# Patient Record
Sex: Male | Born: 1955 | Race: White | Hispanic: No | Marital: Married | Smoking: Former smoker
Health system: Southern US, Community
[De-identification: ages and names within clinical notes are randomized; demographics above are authoritative.]

## PROBLEM LIST (undated history)

## (undated) DIAGNOSIS — K4090 Unilateral inguinal hernia, without obstruction or gangrene, not specified as recurrent: Secondary | ICD-10-CM

## (undated) DIAGNOSIS — Z8719 Personal history of other diseases of the digestive system: Secondary | ICD-10-CM

## (undated) DIAGNOSIS — Q2111 Secundum atrial septal defect: Secondary | ICD-10-CM

## (undated) DIAGNOSIS — M549 Dorsalgia, unspecified: Secondary | ICD-10-CM

## (undated) DIAGNOSIS — I2510 Atherosclerotic heart disease of native coronary artery without angina pectoris: Secondary | ICD-10-CM

## (undated) DIAGNOSIS — E119 Type 2 diabetes mellitus without complications: Secondary | ICD-10-CM

## (undated) DIAGNOSIS — I480 Paroxysmal atrial fibrillation: Secondary | ICD-10-CM

## (undated) DIAGNOSIS — K760 Fatty (change of) liver, not elsewhere classified: Secondary | ICD-10-CM

## (undated) DIAGNOSIS — K5792 Diverticulitis of intestine, part unspecified, without perforation or abscess without bleeding: Secondary | ICD-10-CM

## (undated) DIAGNOSIS — I10 Essential (primary) hypertension: Secondary | ICD-10-CM

## (undated) DIAGNOSIS — E785 Hyperlipidemia, unspecified: Secondary | ICD-10-CM

## (undated) DIAGNOSIS — Z8546 Personal history of malignant neoplasm of prostate: Secondary | ICD-10-CM

## (undated) DIAGNOSIS — K7689 Other specified diseases of liver: Secondary | ICD-10-CM

## (undated) DIAGNOSIS — G4730 Sleep apnea, unspecified: Secondary | ICD-10-CM

## (undated) DIAGNOSIS — R7303 Prediabetes: Secondary | ICD-10-CM

## (undated) DIAGNOSIS — M48061 Spinal stenosis, lumbar region without neurogenic claudication: Secondary | ICD-10-CM

## (undated) DIAGNOSIS — Q211 Atrial septal defect: Secondary | ICD-10-CM

## (undated) HISTORY — DX: Diverticulitis of intestine, part unspecified, without perforation or abscess without bleeding: K57.92

## (undated) HISTORY — PX: FOOT SURGERY: SHX648

## (undated) HISTORY — DX: Essential (primary) hypertension: I10

## (undated) HISTORY — DX: Personal history of other diseases of the digestive system: Z87.19

## (undated) HISTORY — PX: APPENDECTOMY: SHX54

## (undated) HISTORY — DX: Hyperlipidemia, unspecified: E78.5

## (undated) HISTORY — DX: Dorsalgia, unspecified: M54.9

## (undated) HISTORY — PX: LITHOTRIPSY: SUR834

## (undated) HISTORY — DX: Atherosclerotic heart disease of native coronary artery without angina pectoris: I25.10

## (undated) HISTORY — PX: CARDIAC CATHETERIZATION: SHX172

## (undated) HISTORY — DX: Prediabetes: R73.03

## (undated) HISTORY — PX: OTHER SURGICAL HISTORY: SHX169

## (undated) HISTORY — PX: HERNIA REPAIR: SHX51

## (undated) HISTORY — DX: Secundum atrial septal defect: Q21.11

## (undated) HISTORY — PX: CORONARY ARTERY BYPASS GRAFT: SHX141

## (undated) HISTORY — DX: Atrial septal defect: Q21.1

---

## 2004-08-06 ENCOUNTER — Emergency Department: Payer: Self-pay | Admitting: Emergency Medicine

## 2007-02-17 ENCOUNTER — Ambulatory Visit: Payer: Self-pay

## 2008-08-07 ENCOUNTER — Emergency Department: Payer: Self-pay | Admitting: Emergency Medicine

## 2009-04-12 ENCOUNTER — Encounter: Payer: Self-pay | Admitting: Cardiovascular Disease

## 2009-05-10 ENCOUNTER — Encounter: Payer: Self-pay | Admitting: Cardiovascular Disease

## 2009-06-02 DIAGNOSIS — E785 Hyperlipidemia, unspecified: Secondary | ICD-10-CM | POA: Insufficient documentation

## 2009-06-02 DIAGNOSIS — I2510 Atherosclerotic heart disease of native coronary artery without angina pectoris: Secondary | ICD-10-CM | POA: Insufficient documentation

## 2009-06-02 DIAGNOSIS — I10 Essential (primary) hypertension: Secondary | ICD-10-CM | POA: Insufficient documentation

## 2009-06-03 ENCOUNTER — Ambulatory Visit (HOSPITAL_COMMUNITY): Admission: RE | Admit: 2009-06-03 | Discharge: 2009-06-03 | Payer: Self-pay | Admitting: Cardiovascular Disease

## 2009-06-03 ENCOUNTER — Ambulatory Visit: Payer: Self-pay | Admitting: Cardiovascular Disease

## 2009-06-03 ENCOUNTER — Encounter: Payer: Self-pay | Admitting: Cardiovascular Disease

## 2009-06-03 DIAGNOSIS — Q211 Atrial septal defect: Secondary | ICD-10-CM

## 2009-06-10 ENCOUNTER — Encounter (INDEPENDENT_AMBULATORY_CARE_PROVIDER_SITE_OTHER): Payer: Self-pay | Admitting: *Deleted

## 2009-06-17 ENCOUNTER — Telehealth: Payer: Self-pay | Admitting: Cardiovascular Disease

## 2009-06-20 ENCOUNTER — Encounter: Payer: Self-pay | Admitting: Cardiovascular Disease

## 2009-06-28 ENCOUNTER — Encounter: Payer: Self-pay | Admitting: Cardiovascular Disease

## 2009-10-19 ENCOUNTER — Ambulatory Visit: Payer: Self-pay | Admitting: Family Medicine

## 2010-09-07 NOTE — Consult Note (Signed)
Summary: [facility]   [facility]   Imported By: Roderic Ovens 09/13/2009 12:23:06  _____________________________________________________________________  External Attachment:    Type:   Image     Comment:   External Document

## 2011-04-04 ENCOUNTER — Encounter: Payer: Self-pay | Admitting: Cardiovascular Disease

## 2011-07-16 ENCOUNTER — Ambulatory Visit: Payer: Self-pay | Admitting: Cardiovascular Disease

## 2011-07-19 ENCOUNTER — Encounter: Payer: Self-pay | Admitting: *Deleted

## 2011-07-19 ENCOUNTER — Encounter: Payer: Self-pay | Admitting: Cardiovascular Disease

## 2011-07-20 ENCOUNTER — Ambulatory Visit: Payer: Self-pay | Admitting: Cardiovascular Disease

## 2011-08-16 ENCOUNTER — Encounter: Payer: Self-pay | Admitting: Cardiovascular Disease

## 2011-08-16 ENCOUNTER — Ambulatory Visit (INDEPENDENT_AMBULATORY_CARE_PROVIDER_SITE_OTHER): Payer: 59 | Admitting: Cardiovascular Disease

## 2011-08-16 VITALS — BP 138/88 | HR 51 | Wt 240.0 lb

## 2011-08-16 DIAGNOSIS — I2510 Atherosclerotic heart disease of native coronary artery without angina pectoris: Secondary | ICD-10-CM

## 2011-08-16 DIAGNOSIS — Q211 Atrial septal defect: Secondary | ICD-10-CM

## 2011-08-16 DIAGNOSIS — I10 Essential (primary) hypertension: Secondary | ICD-10-CM

## 2011-08-16 DIAGNOSIS — E785 Hyperlipidemia, unspecified: Secondary | ICD-10-CM

## 2011-08-16 NOTE — Progress Notes (Signed)
Hardie is the son of one of my patients Jonny Ruiz who died  In 2008-01-19. I don't have records on him. He has previously been seen by Dr. Gabriel Rung. He had a ASD occluder done by Dr. Starling Manns at [facility] in January 18, 2001. He has been under a lot of stress. 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 He has long standing HTN Rx by Dr. Dossie Arbour. He has had a normal cath after his occluder device. Echo done 01-18-09 reviewed.  Occluder in good position with no residual PFP/ASD and only mild LAE .  He denies dyspnea, palpitatoins, SSCP or syncope. He would like to simplify his BP meds and I suggested that he discuss this with Dr. Dossie Arbour.   ROS: Denies fever, malais, weight loss, blurry vision, decreased visual acuity, cough, sputum, SOB, hemoptysis, pleuritic pain, palpitaitons, heartburn, abdominal pain, melena, lower extremity edema, claudication, or rash.  All other systems reviewed and negative  General: Affect appropriate Healthy:  appears stated age HEENT: normal Neck supple with no adenopathy JVP normal no bruits no thyromegaly Lungs clear with no wheezing and good diaphragmatic motion Heart:  S1/S2 no murmur,rub, gallop or click PMI normal Abdomen: benighn, BS positve, no tenderness, no AAA no bruit.  No HSM or HJR Distal pulses intact with no bruits No edema Neuro non-focal Skin warm and dry No muscular weakness   Current Outpatient Prescriptions  Medication Sig Dispense Refill  . allopurinol (ZYLOPRIM) 300 MG tablet Take 300 mg by mouth daily.        Marland Kitchen aspirin 81 MG tablet Take 81 mg by mouth daily.        . celecoxib (CELEBREX) 200 MG capsule Take 200 mg by mouth daily.      . cloNIDine (CATAPRES) 0.1 MG tablet Take 0.1 mg by mouth 2 (two) times daily.      Marland Kitchen diltiazem (DILACOR XR) 240 MG 24 hr capsule Take 240 mg by mouth daily.        Marland Kitchen ezetimibe (ZETIA) 10 MG tablet Take 10 mg by mouth daily.        Marland Kitchen ibuprofen (ADVIL,MOTRIN) 200 MG tablet Take by mouth every 6 (six) hours as needed.      Marland Kitchen  losartan-hydrochlorothiazide (HYZAAR) 100-25 MG per tablet Take 1 tablet by mouth daily.      . Nebivolol HCl (BYSTOLIC) 20 MG TABS Take 1 tablet by mouth daily.      Marland Kitchen omega-3 acid ethyl esters (LOVAZA) 1 G capsule Take 1 g by mouth 2 (two) times daily.        Allergies  Amlodipine besy-benazepril hcl  Electrocardiogram:  SB rate 51  PR 230 nonspecific T wave changes  Assessment and Plan

## 2011-08-16 NOTE — Assessment & Plan Note (Signed)
Intact by echo 2010  Some conduction issues on ECG.  F/U ECG in a year

## 2011-08-16 NOTE — Assessment & Plan Note (Signed)
Discussed limiting use of salt, NSAI's weight loss with low carb diet.  Has had w/u to R/O RAS and secondary causes.  F/U primary

## 2011-08-16 NOTE — Assessment & Plan Note (Signed)
F/U primary  Been off crestor for 6 months and really no change in arthritis.  Only change is ease of urination.

## 2011-08-16 NOTE — Patient Instructions (Signed)
Your physician wants you to follow-up in: 1 year. You will receive a reminder letter in the mail two months in advance. If you don't receive a letter, please call our office to schedule the follow-up appointment.  

## 2011-09-18 ENCOUNTER — Emergency Department: Payer: Self-pay | Admitting: Emergency Medicine

## 2011-09-18 ENCOUNTER — Telehealth: Payer: Self-pay | Admitting: Cardiovascular Disease

## 2011-09-18 LAB — COMPREHENSIVE METABOLIC PANEL
Anion Gap: 9 (ref 7–16)
Bilirubin,Total: 0.5 mg/dL (ref 0.2–1.0)
Calcium, Total: 9.2 mg/dL (ref 8.5–10.1)
Creatinine: 1.56 mg/dL — ABNORMAL HIGH (ref 0.60–1.30)
EGFR (Non-African Amer.): 50 — ABNORMAL LOW
Glucose: 133 mg/dL — ABNORMAL HIGH (ref 65–99)
Osmolality: 290 (ref 275–301)
Potassium: 3.7 mmol/L (ref 3.5–5.1)
Sodium: 141 mmol/L (ref 136–145)

## 2011-09-18 LAB — URINALYSIS, COMPLETE
Bacteria: NONE SEEN
Bilirubin,UR: NEGATIVE
Blood: NEGATIVE
Glucose,UR: NEGATIVE mg/dL (ref 0–75)
Hyaline Cast: 24
Ketone: NEGATIVE
Protein: NEGATIVE
RBC,UR: <1 /HPF (ref 0–5)
Specific Gravity: 1.01 (ref 1.003–1.030)
Squamous Epithelial: NONE SEEN
WBC UR: <1 /HPF (ref 0–5)

## 2011-09-18 LAB — CBC
HCT: 42.1 % (ref 40.0–52.0)
MCH: 29.4 pg (ref 26.0–34.0)
MCV: 89 fL (ref 80–100)
Platelet: 138 10*3/uL — ABNORMAL LOW (ref 150–440)
RDW: 14.6 % — ABNORMAL HIGH (ref 11.5–14.5)

## 2011-09-18 NOTE — Telephone Encounter (Signed)
DAUGHTER NOTIFIED  MAY HAVE DR Mariah Milling CONSULT  IF CARDIOLOGY NEEDED . Marland Kitchen/CY

## 2011-09-18 NOTE — Telephone Encounter (Signed)
New Problem -daugher reporting to Dr. Eden Emms   Patient Daughter Aaran Enberg [phone], patient was taken by ambulance from work with pain between shoulder blades, right bundle branch block per EMR  Heart rate 46, being transported [place], BP 104 Systolic........Marland Kitchenplease talk with ER Doc at [facility]

## 2011-09-18 NOTE — Telephone Encounter (Signed)
Can have Dossie Arbour see patient at [employer] [employer]

## 2011-09-18 NOTE — Telephone Encounter (Signed)
SPOKE WITH PT'S DAUGHTER   WHILE AT DR'S OFFICE  BECAME REAL DIZZY  AND C/O PAIN  TO BACK B/P AT THAT TIME WAS 80/56  AND HEART RATE WAS 46  PER EMT'S  AND HAS RBBB  DR CRISSMON IS ADJUSTING  B/P MEDS AT THIS TIME DAUGHTER WANTS TO KNOW IF PT  SHOULD BE TRANSFERRED TO [place] . INFORMED IF ONLY B/P  ISSUE COULD BE TREATED  AT [facility]  .WILL FORWARD TO DR Eden Emms FOR REVIEW .Zack Seal

## 2011-10-16 ENCOUNTER — Observation Stay: Payer: Self-pay | Admitting: Internal Medicine

## 2011-10-16 LAB — URINALYSIS, COMPLETE
Bacteria: NONE SEEN
Blood: NEGATIVE
Glucose,UR: NEGATIVE mg/dL (ref 0–75)
Nitrite: NEGATIVE
Protein: NEGATIVE
Specific Gravity: 1.014 (ref 1.003–1.030)
WBC UR: <1 /HPF (ref 0–5)

## 2011-10-16 LAB — BASIC METABOLIC PANEL
Anion Gap: 12 (ref 7–16)
Calcium, Total: 8.9 mg/dL (ref 8.5–10.1)
Chloride: 97 mmol/L — ABNORMAL LOW (ref 98–107)
Creatinine: 1.11 mg/dL (ref 0.60–1.30)
EGFR (Non-African Amer.): >60
Glucose: 130 mg/dL — ABNORMAL HIGH (ref 65–99)
Osmolality: 278 (ref 275–301)
Potassium: 3.3 mmol/L — ABNORMAL LOW (ref 3.5–5.1)
Sodium: 137 mmol/L (ref 136–145)

## 2011-10-16 LAB — CBC
HCT: 39.5 % — ABNORMAL LOW (ref 40.0–52.0)
MCHC: 33.9 g/dL (ref 32.0–36.0)
MCV: 88 fL (ref 80–100)
Platelet: 132 10*3/uL — ABNORMAL LOW (ref 150–440)
RBC: 4.47 10*6/uL (ref 4.40–5.90)
RDW: 14.3 % (ref 11.5–14.5)

## 2011-10-16 LAB — TROPONIN I: Troponin-I: <0.02 ng/mL

## 2011-10-16 LAB — RAPID INFLUENZA A&B ANTIGENS

## 2011-10-17 LAB — BASIC METABOLIC PANEL
Calcium, Total: 8.6 mg/dL (ref 8.5–10.1)
Chloride: 97 mmol/L — ABNORMAL LOW (ref 98–107)
Co2: 27 mmol/L (ref 21–32)
Creatinine: 1.01 mg/dL (ref 0.60–1.30)
EGFR (African American): >60
Osmolality: 269 (ref 275–301)
Potassium: 3.4 mmol/L — ABNORMAL LOW (ref 3.5–5.1)
Sodium: 133 mmol/L — ABNORMAL LOW (ref 136–145)

## 2011-10-17 LAB — HEMOGLOBIN A1C: Hemoglobin A1C: 7.5 % — ABNORMAL HIGH (ref 4.2–6.3)

## 2011-10-17 LAB — CBC WITH DIFFERENTIAL/PLATELET
Basophil #: 0 10*3/uL (ref 0.0–0.1)
Eosinophil #: 0 10*3/uL (ref 0.0–0.7)
Eosinophil %: 0.3 %
HCT: 41 % (ref 40.0–52.0)
Lymphocyte #: 1 10*3/uL (ref 1.0–3.6)
Lymphocyte %: 19.6 %
MCHC: 33.5 g/dL (ref 32.0–36.0)
Monocyte %: 13 %
Neutrophil #: 3.5 10*3/uL (ref 1.4–6.5)
Platelet: 128 10*3/uL — ABNORMAL LOW (ref 150–440)
RDW: 14.3 % (ref 11.5–14.5)

## 2011-10-17 LAB — LIPID PANEL
Cholesterol: 116 mg/dL (ref 0–200)
Ldl Cholesterol, Calc: 67 mg/dL (ref 0–100)

## 2011-10-17 LAB — MAGNESIUM: Magnesium: 1.4 mg/dL — ABNORMAL LOW

## 2011-10-22 LAB — CULTURE, BLOOD (SINGLE)

## 2011-11-20 ENCOUNTER — Ambulatory Visit: Payer: Self-pay | Admitting: Internal Medicine

## 2011-12-05 ENCOUNTER — Ambulatory Visit: Payer: Self-pay | Admitting: Internal Medicine

## 2011-12-07 DIAGNOSIS — R0602 Shortness of breath: Secondary | ICD-10-CM | POA: Insufficient documentation

## 2012-01-07 ENCOUNTER — Ambulatory Visit: Payer: Self-pay | Admitting: Internal Medicine

## 2012-02-04 ENCOUNTER — Ambulatory Visit: Payer: Self-pay | Admitting: Internal Medicine

## 2012-05-30 ENCOUNTER — Ambulatory Visit: Payer: Self-pay | Admitting: Unknown Physician Specialty

## 2012-09-08 DIAGNOSIS — K760 Fatty (change of) liver, not elsewhere classified: Secondary | ICD-10-CM | POA: Insufficient documentation

## 2013-01-05 DIAGNOSIS — G4730 Sleep apnea, unspecified: Secondary | ICD-10-CM | POA: Insufficient documentation

## 2014-07-19 ENCOUNTER — Ambulatory Visit: Payer: Self-pay | Admitting: Family Medicine

## 2014-10-08 ENCOUNTER — Ambulatory Visit: Payer: Self-pay | Admitting: Gastroenterology

## 2014-10-25 DIAGNOSIS — K4090 Unilateral inguinal hernia, without obstruction or gangrene, not specified as recurrent: Secondary | ICD-10-CM | POA: Insufficient documentation

## 2014-10-25 DIAGNOSIS — N281 Cyst of kidney, acquired: Secondary | ICD-10-CM | POA: Insufficient documentation

## 2014-11-28 NOTE — H&P (Signed)
PATIENT NAME:  Ernest Pennington, Ernest Pennington MR#:  [id number] DATE OF BIRTH:  13-Apr-1956  DATE OF ADMISSION:  10/16/2011  ER REFERRING PHYSICIAN: Lisa Roca, MD   PRIMARY CARE PHYSICIAN: Golden Pop, MD   CHIEF COMPLAINT: Cough, fever.   HISTORY OF PRESENT ILLNESS: The patient is a 59 year old male with past medical history of hypertension, hyperlipidemia and gout,  history of ASD, status post occluder placement at [facility] more than 15 years ago, who was in his usual state of health until about 3 to 4 days ago when he started feeling sick. He developed a nonproductive dry cough associated with fever and chills. He felt lightheaded and dizzy this morning. Therefore, he went to see his primary care physician where his oxygen saturation was found to be 90%. He was given a breathing treatment at his primary care physician's office and sent to the Emergency Room. He has not taken any antibiotics so far. He has been trying to self-treat himself with Coricidin and Alka-Seltzer. In the Emergency Room, he was found to have a fever of 101. He denies any shortness of breath or chest pain. He reports that his ribs are hurting because of severe coughing. He is having generalized aches and pains. 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 He reports that several people in his store have been sick. He denies any urinary symptoms, diarrhea or constipation, nausea, vomiting, or belly pain.   ALLERGIES: Lasix, atenolol.  PAST MEDICAL HISTORY:  1. Hypertension. 2. Hyperlipidemia. 3. Gout. 4. History of ASD, status post  occluder placement at [facility] more than 15 years ago.   SOCIAL HISTORY: He denies history of smoking, alcohol or drug abuse. He is married and lives with his wife. 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   FAMILY HISTORY: Mother had Alzheimer's. Father died of lung cancer and heart disease.   REVIEW OF SYSTEMS: CONSTITUTIONAL: Reports fever, fatigue, weakness. EYES: Denies any blurred or  double vision. ENT: Denies any tinnitus, ear pain. RESPIRATORY: Reports cough, denies any wheezing. CARDIOVASCULAR: Denies any chest pain, palpitations, syncope. GI: Denies any nausea, vomiting, diarrhea, or abdominal pain. GU: Denies any dysuria or hematuria. ENDOCRINE: Denies any polyuria or nocturia. HEMATOLOGIC/LYMPHATIC:  Denies any anemia or easy bruisability. INTEGUMENT: Denies any acne, rash. MUSCULOSKELETAL: Reports generalized muscle aches and pain. He has a history of gout. NEUROLOGICAL: Denies any numbness or weakness. PSYCHIATRIC: Denies any history of anxiety. Has history of insomnia and takes Ambien as needed.   PHYSICAL EXAMINATION:  VITAL SIGNS: Temperature 101, pulse 94, respiratory rate 20, blood pressure 170/89, pulse oximetry 93% on room air.   GENERAL: The patient is a 59 year old Caucasian male who is lying in bed. He is in some distress.   HEENT: Head: Atraumatic, normocephalic. Eyes: No pallor, icterus, or cyanosis. Pupils are equal, round and reactive to light and accommodation. Extraocular movements intact. ENT: Dry mucous membranes. No oropharyngeal erythema or thrush.   NECK: Supple. No masses. No JVD. No thyromegaly. No lymphadenopathy.   CHEST WALL: No tenderness to palpation. Not using accessory muscles of respiration. No intercostal muscle retractions.   LUNGS: Bilaterally clear to auscultation. No wheezing, rales, or rhonchi.   CARDIOVASCULAR: S1, S2, regular.  No murmur, rubs, or gallops.   ABDOMEN: Soft, nontender, nondistended. No guarding or rigidity. No organomegaly. Normal bowel sounds.   SKIN: No rashes or lesions.   PERIPHERIES: No pedal edema, 2+ pedal pulses.   MUSCULOSKELETAL: No cyanosis or clubbing.   NEUROLOGICAL: Awake, alert,  oriented x3. Nonfocal neurological exam. Cranial nerves are grossly intact.   PSYCHIATRIC: Normal mood and affect.   LABORATORY, DIAGNOSTIC AND RADIOLOGICAL DATA:  White count 4.3, hemoglobin 13.4, hematocrit 39.5,  platelet count 132. Glucose 130, BUN 20, creatinine 1.11, sodium 137, potassium 3.3, chloride 97, CO2 28.  Cardiac enzymes are negative.  Chest x-ray read by the radiologist shows lung fields are clear. There is mild stable cardiac enlargement.   ASSESSMENT AND PLAN: A 59 year old male with past medical history of hypertension, hyperlipidemia, gout, ASD status post occluder placement, presents with cough, fever with chills.   1. Fever with chills and rigors: Possibly due to viral syndrome versus pneumonia. The ER physician thinks the patient has pneumonia; however, his chest exam is clear and the radiologist did not see any infiltrate on chest x-ray. I will admit the patient to the hospital, start him on respiratory treatments, get blood cultures and start empiric antibiotics. I will place on p.r.n. Robitussin and Tussionex. Will also check Influenza A and B, patient says that he did receive the vaccine this year. 2. Accelerated hypertension: Possibly due to acute stress. I will restart the patient's medications and also place on p.r.n. hydralazine. I will adjust medications as needed to achieve good hypertensive control.  3. Thrombocytopenia: No old labs are available for comparison. I will monitor it closely.  4. Hyperglycemia, possibly reactive: I will check a hemoglobin A1c.  5. Mild dehydration: Possibly due to fever. I will hydrate with IV fluids.  6. Hypokalemia: I will give oral supplementation.  7. Hyperlipidemia: I will continue Zetia and Lovaza.  8. Gout: I will continue allopurinol.   I discussed with the ED physician, discussed with the patient and his wife the plan of care and management.   TIME SPENT: 75 minutes.  ____________________________ Cherre Huger, MD sp:cbb D: 10/16/2011 12:06:17 ET T: 10/16/2011 12:40:58 ET JOB#: [id number]  cc: Cherre Huger, MD, <Dictator> Guadalupe Maple, MD Cherre Huger MD ELECTRONICALLY SIGNED 10/18/2011 16:30

## 2014-11-28 NOTE — Discharge Summary (Signed)
PATIENT NAME:  Ernest Pennington, Ernest Pennington MR#:  [id number] DATE OF BIRTH:  1955-11-01  DATE OF ADMISSION:  10/16/2011 DATE OF DISCHARGE:  10/17/2011  ADMITTING DIAGNOSES: Cough, fever, chills.   DISCHARGE DIAGNOSES:  1. Cough, fever, chills due to influenza A.  2. Cough possibly due to bronchitis. Patient will be placed on azithromycin.  3. Hyperglycemia on presentation; noted to have a hemoglobin A1c of 7.5 suggesting possible diabetes. I will not place patient on any medications for time being due to current illness but will have him check his blood sugars and when he sees his regular doctor they will need to decide to see if he needs any treatment.  4. Accelerated hypertension on presentation, likely due to his acute illness, now improved.  5. Thrombocytopenia. Will need to have this be followed as an outpatient.  6. Hypomagnesemia and hypokalemia being replaced.  7. Hyperlipidemia.  8. Gout.  9. History of ASD status post ocular fluid replacement in [facility] more than 15 years ago.   LABORATORY, DIAGNOSTIC AND RADIOLOGICAL DATA: Admitting WBC count 4.3, hemoglobin 13.4, hematocrit 39.5, platelet count 132, glucose 130, BUN 20, creatinine 1.11, sodium 137, potassium 3.3, chloride 97, CO2 28. Chest x-ray is mild stable cardiac enlargement, otherwise chest x-ray is clear. Hemoglobin A1c 7.5. Cholesterol total 116, triglycerides 75, HDL 34, magnesium 1.4. Troponin less than 0.02. Blood cultures x2 no growth.   HOSPITAL COURSE: Please see history and physical done by the admitting physician. Patient is a 59 year old white male with medical history of hypertension, hyperlipidemia and gout presented with 3 to 4 days of having fevers, chills, nonproductive cough. Patient was seen in the ED and we were asked to admit the patient. Initially there was concern for pneumonia. However, his chest x-ray came back negative. Influenza A and B were checked. He tested positive for influenza A antigen. Patient was started on  Tamiflu and currently is feeling a little better, however, for complete recovery will likely take another 4 to 5 days. At this time patient is stable for discharge. Also of note he was noted to have an elevated blood glucose of 130s. Hemoglobin A1c revealed him to be having 7.7, likely representing type 2 diabetes. In light of his acute illness and lack of p.o. intake at this time I will not start him on any medications. Patient was instructed on a diabetic diet as well as checking his blood sugars. He will need to keep a log to take to Dr. Jeananne Rama to see if he needs any further treatment. At this time he is stable for discharge.   DISCHARGE MEDICATIONS:  1. Zetia 10 daily.  2. Ambien 10 at bedtime p.r.n.  3. Lovaza 1000 mg two caps daily.  4. Hydrochlorothiazide losartan 25/100, 1 tab p.o. daily.  5. Hydralazine 25 mg 1 tab p.o. t.i.d.  6. Diltiazem CD 240 daily.  7. Clonidine 0.1, 1 tab p.o. b.i.d.  8. Allopurinol 300 daily.  9. Aspirin 81, 1 tab p.o. daily.  10. Vicodin 1 to 2 tabs q.8 p.r.n. pain.  11. Tussionex 5 mL p.o. every 12 hours.  12. Robitussin 15 mL every six hours p.r.n. cough.  13. Tylenol 650 p.o. every six hours p.r.n. fever. 14. Tamiflu 75 mg p.o. every 12 hours x4 days.  15. Mag oxide 400 mg p.o. b.i.d. x3 days. 16. Azithromycin 500 p.o. daily x4 days.   HOME OXYGEN: No.  DIET: Low sodium ADA diet.   ACTIVITY: As tolerated.   FOLLOW UP: Follow up with Dr.  Mark Crissman next week. Patient is recommended to keep a log of blood sugars to take to primary M.D. Check blood sugars prior to each meal t.i.d. and before bedtime.   TIME SPENT: 35 minutes.  ____________________________ Lafonda Mosses Posey Pronto, MD shp:cms D: 10/17/2011 14:04:33 ET T: 10/18/2011 11:14:33 ET  JOB#: [id number] cc: Khyli Swaim H. Posey Pronto, MD, <Dictator> Guadalupe Maple, MD Alric Seton MD ELECTRONICALLY SIGNED 10/20/2011 13:12

## 2014-12-06 DIAGNOSIS — Z8774 Personal history of (corrected) congenital malformations of heart and circulatory system: Secondary | ICD-10-CM | POA: Insufficient documentation

## 2014-12-06 DIAGNOSIS — E119 Type 2 diabetes mellitus without complications: Secondary | ICD-10-CM | POA: Insufficient documentation

## 2014-12-06 DIAGNOSIS — G4700 Insomnia, unspecified: Secondary | ICD-10-CM | POA: Insufficient documentation

## 2014-12-06 DIAGNOSIS — Z8042 Family history of malignant neoplasm of prostate: Secondary | ICD-10-CM | POA: Insufficient documentation

## 2015-03-15 DIAGNOSIS — R972 Elevated prostate specific antigen [PSA]: Secondary | ICD-10-CM | POA: Insufficient documentation

## 2015-04-07 DIAGNOSIS — N401 Enlarged prostate with lower urinary tract symptoms: Secondary | ICD-10-CM | POA: Insufficient documentation

## 2015-04-07 DIAGNOSIS — R339 Retention of urine, unspecified: Secondary | ICD-10-CM | POA: Insufficient documentation

## 2015-04-07 DIAGNOSIS — N138 Other obstructive and reflux uropathy: Secondary | ICD-10-CM | POA: Insufficient documentation

## 2015-11-29 ENCOUNTER — Other Ambulatory Visit: Payer: Self-pay | Admitting: Family Medicine

## 2015-11-29 ENCOUNTER — Ambulatory Visit: Payer: Self-pay

## 2015-11-29 ENCOUNTER — Ambulatory Visit
Admission: RE | Admit: 2015-11-29 | Discharge: 2015-11-29 | Disposition: A | Discharge status: Home / Self Care | Payer: 59 | Source: Ambulatory Visit | Attending: Family Medicine | Admitting: Family Medicine

## 2015-11-29 DIAGNOSIS — K7689 Other specified diseases of liver: Secondary | ICD-10-CM | POA: Diagnosis not present

## 2015-11-29 DIAGNOSIS — I700 Atherosclerosis of aorta: Secondary | ICD-10-CM | POA: Insufficient documentation

## 2015-11-29 DIAGNOSIS — R109 Unspecified abdominal pain: Secondary | ICD-10-CM | POA: Diagnosis present

## 2015-11-29 DIAGNOSIS — N200 Calculus of kidney: Secondary | ICD-10-CM | POA: Diagnosis not present

## 2015-11-29 DIAGNOSIS — K760 Fatty (change of) liver, not elsewhere classified: Secondary | ICD-10-CM | POA: Diagnosis not present

## 2016-04-26 DIAGNOSIS — C61 Malignant neoplasm of prostate: Secondary | ICD-10-CM | POA: Insufficient documentation

## 2016-05-21 DIAGNOSIS — M109 Gout, unspecified: Secondary | ICD-10-CM | POA: Insufficient documentation

## 2016-08-06 HISTORY — PX: PROSTATE SURGERY: SHX751

## 2017-11-18 ENCOUNTER — Encounter: Payer: Self-pay | Admitting: Gastroenterology

## 2017-11-18 ENCOUNTER — Encounter (INDEPENDENT_AMBULATORY_CARE_PROVIDER_SITE_OTHER): Payer: Self-pay

## 2017-11-18 ENCOUNTER — Ambulatory Visit: Payer: 59 | Admitting: Gastroenterology

## 2017-11-18 VITALS — BP 180/124 | HR 83 | Ht 73.0 in | Wt 236.2 lb

## 2017-11-18 DIAGNOSIS — K5732 Diverticulitis of large intestine without perforation or abscess without bleeding: Secondary | ICD-10-CM

## 2017-11-18 DIAGNOSIS — R739 Hyperglycemia, unspecified: Secondary | ICD-10-CM | POA: Insufficient documentation

## 2017-11-18 DIAGNOSIS — K625 Hemorrhage of anus and rectum: Secondary | ICD-10-CM

## 2017-11-18 DIAGNOSIS — K5909 Other constipation: Secondary | ICD-10-CM

## 2017-11-18 MED ORDER — METRONIDAZOLE 500 MG PO TABS: 500.0000 mg | ORAL_TABLET | Freq: Two times a day (BID) | ORAL | 0 refills | Status: AC

## 2017-11-18 MED ORDER — CIPROFLOXACIN HCL 500 MG PO TABS: 500.0000 mg | ORAL_TABLET | Freq: Two times a day (BID) | ORAL | 0 refills | Status: AC

## 2017-11-18 NOTE — Patient Instructions (Signed)
High-Fiber Diet  Fiber, also called dietary fiber, is a type of carbohydrate found in fruits, vegetables, whole grains, and beans. A high-fiber diet can have many health benefits. Your health care provider may recommend a high-fiber diet to help:  · Prevent constipation. Fiber can make your bowel movements more regular.  · Lower your cholesterol.  · Relieve hemorrhoids, uncomplicated diverticulosis, or irritable bowel syndrome.  · Prevent overeating as part of a weight-loss plan.  · Prevent heart disease, type 2 diabetes, and certain cancers.    What is my plan?  The recommended daily intake of fiber includes:  · 38 grams for men under age 50.  · 30 grams for men over age 50.  · 25 grams for women under age 50.  · 21 grams for women over age 50.    You can get the recommended daily intake of dietary fiber by eating a variety of fruits, vegetables, grains, and beans. Your health care provider may also recommend a fiber supplement if it is not possible to get enough fiber through your diet.  What do I need to know about a high-fiber diet?  · Fiber supplements have not been widely studied for their effectiveness, so it is better to get fiber through food sources.  · Always check the fiber content on the nutrition facts label of any prepackaged food. Look for foods that contain at least 5 grams of fiber per serving.  · Ask your dietitian if you have questions about specific foods that are related to your condition, especially if those foods are not listed in the following section.  · Increase your daily fiber consumption gradually. Increasing your intake of dietary fiber too quickly may cause bloating, cramping, or gas.  · Drink plenty of water. Water helps you to digest fiber.  What foods can I eat?  Grains  Whole-grain breads. Multigrain cereal. Oats and oatmeal. Brown rice. Barley. Bulgur wheat. Millet. Bran muffins. Popcorn. Rye wafer crackers.  Vegetables   Sweet potatoes. Spinach. Kale. Artichokes. Cabbage. Broccoli. Green peas. Carrots. Squash.  Fruits  Berries. Pears. Apples. Oranges. Avocados. Prunes and raisins. Dried figs.  Meats and Other Protein Sources  Navy, kidney, pinto, and soy beans. Split peas. Lentils. Nuts and seeds.  Dairy  Fiber-fortified yogurt.  Beverages  Fiber-fortified soy milk. Fiber-fortified orange juice.  Other  Fiber bars.  The items listed above may not be a complete list of recommended foods or beverages. Contact your dietitian for more options.  What foods are not recommended?  Grains  White bread. Pasta made with refined flour. White rice.  Vegetables  Fried potatoes. Canned vegetables. Well-cooked vegetables.  Fruits  Fruit juice. Cooked, strained fruit.  Meats and Other Protein Sources  Fatty cuts of meat. Fried poultry or fried fish.  Dairy  Milk. Yogurt. Cream cheese. Sour cream.  Beverages  Soft drinks.  Other  Cakes and pastries. Butter and oils.  The items listed above may not be a complete list of foods and beverages to avoid. Contact your dietitian for more information.  What are some tips for including high-fiber foods in my diet?  · Eat a wide variety of high-fiber foods.  · Make sure that half of all grains consumed each day are whole grains.  · Replace breads and cereals made from refined flour or white flour with whole-grain breads and cereals.  · Replace white rice with brown rice, bulgur wheat, or millet.  · Start the day with a breakfast that is high in fiber,   such as a cereal that contains at least 5 grams of fiber per serving.  · Use beans in place of meat in soups, salads, or pasta.  · Eat high-fiber snacks, such as berries, raw vegetables, nuts, or popcorn.  This information is not intended to replace advice given to you by your health care provider. Make sure you discuss any questions you have with your health care provider.  Document Released: 07/23/2005 Document Revised: 12/29/2015 Document Reviewed: 01/05/2014   Elsevier Interactive Patient Education © 2018 [employer]

## 2017-11-18 NOTE — Progress Notes (Signed)
Cephas Darby, MD [address]  [place]  [place], [place] [address]  Main: [phone]  Fax: [phone]    Gastroenterology Consultation  Referring Provider:     Hortencia Pilar, MD Primary Care Physician:  Hortencia Pilar, MD Primary Gastroenterologist:  Dr. Cephas Darby Reason for Consultation:     Chronic lower abdominal pain, constipation        HPI:   Ernest Pennington is a 62 y.o. male referred by Dr. Hortencia Pilar, MD  for consultation & management of chronic constipation and lower abdominal pain. Patient reports that he has been experiencing chronic constipation and lower abdominal pain since he had a prostate surgery in 2018. He reports that he had an attack of diverticulitis in the past and also had severe diverticular bleed for which he was admitted to [facility] in 07/2017. At that time his lowest hemoglobin was 8.6. His follow-up hemoglobin a month later was 7.1, normal MCV. He said he was supposed to have a colonoscopy and take a bowel prep and the procedure as never scheduled. He has been experiencing severe constipation, takes MiraLAX as needed. He consumes red meat, carbonated beverages regularly. He denies rectal bleeding, diarrhea. He does experience significant bloating when he takes MiraLAX associated with abdominal cramps. He always feels sore in his lower abdomen  NSAIDs: none  Antiplts/Anticoagulants/Anti thrombotics: none  GI Procedures: colonoscopy in 2013, was found to have polyps. Report not available  Past Medical History:  Diagnosis Date  . ATRIAL SEPTAL DEFECT   . Back pain   . CAD   . Diverticulitis   . History of rectal bleeding   . HYPERLIPIDEMIA   . HYPERTENSION   . Pre-diabetes        Current Outpatient Medications:  .  diltiazem (CARDIZEM CD) 240 MG 24 hr capsule, Take by mouth., Disp: , Rfl:  .  polyethylene glycol (MIRALAX / GLYCOLAX) packet, Take by mouth., Disp: , Rfl:  .  Acetaminophen (MAPAP) 500 MG coapsule, Take by  mouth., Disp: , Rfl:  .  allopurinol (ZYLOPRIM) 300 MG tablet, Take 300 mg by mouth daily.  , Disp: , Rfl:  .  amoxicillin-clavulanate (AUGMENTIN) 875-125 MG tablet, TAKE 1 TABLET BY MOUTH TWICE A DAY FOR 7 DAYS, Disp: , Rfl: 0 .  aspirin 81 MG tablet, Take 81 mg by mouth daily.  , Disp: , Rfl:  .  celecoxib (CELEBREX) 200 MG capsule, Take 200 mg by mouth daily., Disp: , Rfl:  .  ciprofloxacin (CIPRO) 500 MG tablet, Take 1 tablet (500 mg total) by mouth 2 (two) times daily for 14 days., Disp: 28 tablet, Rfl: 0 .  cloNIDine (CATAPRES) 0.1 MG tablet, Take 0.1 mg by mouth 2 (two) times daily., Disp: , Rfl:  .  cloNIDine (CATAPRES) 0.2 MG tablet, , Disp: , Rfl:  .  diltiazem (DILACOR XR) 240 MG 24 hr capsule, Take 240 mg by mouth daily.  , Disp: , Rfl:  .  diltiazem (TIAZAC) 240 MG 24 hr capsule, , Disp: , Rfl:  .  docusate sodium (COLACE) 100 MG capsule, Take by mouth., Disp: , Rfl:  .  ezetimibe (ZETIA) 10 MG tablet, Take 10 mg by mouth daily.  , Disp: , Rfl:  .  hydrALAZINE (APRESOLINE) 100 MG tablet, Take 50 mg by mouth., Disp: , Rfl:  .  ibuprofen (ADVIL,MOTRIN) 200 MG tablet, Take by mouth every 6 (six) hours as needed., Disp: , Rfl:  .  losartan (COZAAR) 100 MG tablet, ,  Disp: , Rfl:  .  losartan-hydrochlorothiazide (HYZAAR) 100-25 MG per tablet, Take 1 tablet by mouth daily., Disp: , Rfl:  .  metFORMIN (GLUCOPHAGE) 500 MG tablet, , Disp: , Rfl:  .  metroNIDAZOLE (FLAGYL) 500 MG tablet, Take 1 tablet (500 mg total) by mouth 2 (two) times daily for 14 days., Disp: 28 tablet, Rfl: 0 .  Nebivolol HCl (BYSTOLIC) 20 MG TABS, Take 1 tablet by mouth daily., Disp: , Rfl:  .  omega-3 acid ethyl esters (LOVAZA) 1 G capsule, Take 1 g by mouth 2 (two) times daily., Disp: , Rfl:  .  spironolactone (ALDACTONE) 25 MG tablet, , Disp: , Rfl:  .  spironolactone (ALDACTONE) 50 MG tablet, , Disp: , Rfl:  .  traZODone (DESYREL) 100 MG tablet, Take 100 mg by mouth., Disp: , Rfl:    History reviewed. No  pertinent family history.   Social History   Tobacco Use  . Smoking status: Never Smoker  . Smokeless tobacco: Never Used  Substance Use Topics  . Alcohol use: Not Currently  . Drug use: Never    Allergies as of 11/18/2017 - Review Complete 08/16/2011  Allergen Reaction Noted  . Amlodipine Anaphylaxis 10/23/2011  . Furosemide Swelling 10/23/2011  . Tamsulosin Swelling 09/07/2015  . Amlodipine besy-benazepril hcl    . Amlodipine besy-benazepril hcl  12/06/2014  . Atenolol Other (See Comments) 10/23/2011  . Statins Other (See Comments) 09/23/2015    Review of Systems:    All systems reviewed and negative except where noted in HPI.   Physical Exam:  BP (!) 180/124 (BP Location: Left Arm, Patient Position: Sitting, Cuff Size: Large)   Pulse 83   Ht 6\' 1"  (1.854 m)   Wt 236 lb 3.2 oz (107.1 kg)   BMI 31.16 kg/m  No LMP for male patient.  General:   Alert,  Well-developed, well-nourished, pleasant and cooperative in NAD Head:  Normocephalic and atraumatic. Eyes:  Sclera clear, no icterus.   Conjunctiva pink. Ears:  Normal auditory acuity. Nose:  No deformity, discharge, or lesions. Mouth:  No deformity or lesions,oropharynx pink & moist. Neck:  Supple; no masses or thyromegaly. Lungs:  Respirations even and unlabored.  Clear throughout to auscultation.   No wheezes, crackles, or rhonchi. No acute distress. Heart:  Regular rate and rhythm; no murmurs, clicks, rubs, or gallops. Abdomen:  Normal bowel sounds. Soft, mild lower abdominal tenderness and non-distended. Limited exam due to abdominal obesity. No guarding or rebound tenderness.   Rectal: Not performed Msk:  Symmetrical without gross deformities. Good, equal movement & strength bilaterally. Pulses:  Normal pulses noted. Extremities:  No clubbing or edema.  No cyanosis. Neurologic:  Alert and oriented x3;  grossly normal neurologically. Skin:  Intact without significant lesions or rashes. No jaundice. Psych:  Alert  and cooperative. Normal mood and affect.  Imaging Studies: reviewed  Assessment and Plan:   Ernest Pennington is a 62 y.o. male with metabolic syndrome, fatty liver disease presents with chronic constipation, history of diverticular bleed and diverticulitis  Chronic constipation: Recommend high-fiber diet and fiber supplements MiraLAX daily  History of diverticulitis, lower abdominal pain Empiric trial of Cipro and Flagyl 500 mg twice a day for 2 weeks  History of for colon polyps in 2013 Recommend colonoscopy  I have discussed alternative options, risks & benefits,  which include, but are not limited to, bleeding, infection, perforation,respiratory complication & drug reaction.  The patient agrees with this plan & written consent will be obtained.  Follow up in 4-6 weeks   Cephas Darby, MD

## 2017-11-20 ENCOUNTER — Ambulatory Visit: Payer: 59 | Admitting: Gastroenterology

## 2017-12-02 ENCOUNTER — Other Ambulatory Visit: Payer: Self-pay | Admitting: Gastroenterology

## 2017-12-03 LAB — IRON AND TIBC
Iron Saturation: 21 % (ref 15–55)
Iron: 56 ug/dL (ref 38–169)
TIBC: 262 ug/dL (ref 250–450)
UIBC: 206 ug/dL (ref 111–343)

## 2017-12-03 LAB — CBC WITH DIFFERENTIAL/PLATELET
BASOS ABS: 0 10*3/uL (ref 0.0–0.2)
Basos: 0 %
EOS (ABSOLUTE): 0.2 10*3/uL (ref 0.0–0.4)
Eos: 3 %
Hematocrit: 41 % (ref 37.5–51.0)
Hemoglobin: 13.8 g/dL (ref 13.0–17.7)
Immature Grans (Abs): 0 10*3/uL (ref 0.0–0.1)
Immature Granulocytes: 0 %
LYMPHS ABS: 1.5 10*3/uL (ref 0.7–3.1)
Lymphs: 24 %
MCH: 26.9 pg (ref 26.6–33.0)
MCHC: 33.7 g/dL (ref 31.5–35.7)
MCV: 80 fL (ref 79–97)
MONOS ABS: 0.4 10*3/uL (ref 0.1–0.9)
Monocytes: 6 %
Neutrophils Absolute: 4.2 10*3/uL (ref 1.4–7.0)
Neutrophils: 67 %
PLATELETS: 167 10*3/uL (ref 150–379)
RBC: 5.13 x10E6/uL (ref 4.14–5.80)
RDW: 16.9 % — ABNORMAL HIGH (ref 12.3–15.4)
WBC: 6.3 10*3/uL (ref 3.4–10.8)

## 2017-12-03 LAB — FERRITIN: Ferritin: 50 ng/mL (ref 30–400)

## 2017-12-03 LAB — VITAMIN B12: Vitamin B-12: 487 pg/mL (ref 232–1245)

## 2017-12-04 ENCOUNTER — Encounter: Payer: Self-pay | Admitting: *Deleted

## 2017-12-05 ENCOUNTER — Ambulatory Visit: Payer: 59 | Admitting: Anesthesiology

## 2017-12-05 ENCOUNTER — Encounter: Payer: Self-pay | Admitting: *Deleted

## 2017-12-05 ENCOUNTER — Encounter
Admission: RE | Disposition: A | Discharge status: Home / Self Care | Payer: Self-pay | Source: Ambulatory Visit | Attending: Gastroenterology

## 2017-12-05 ENCOUNTER — Ambulatory Visit
Admission: RE | Admit: 2017-12-05 | Discharge: 2017-12-05 | Disposition: A | Discharge status: Home / Self Care | Payer: 59 | Source: Ambulatory Visit | Attending: Gastroenterology | Admitting: Gastroenterology

## 2017-12-05 DIAGNOSIS — Z86010 Personal history of colonic polyps: Secondary | ICD-10-CM | POA: Insufficient documentation

## 2017-12-05 DIAGNOSIS — Z1211 Encounter for screening for malignant neoplasm of colon: Secondary | ICD-10-CM | POA: Insufficient documentation

## 2017-12-05 DIAGNOSIS — I2510 Atherosclerotic heart disease of native coronary artery without angina pectoris: Secondary | ICD-10-CM | POA: Insufficient documentation

## 2017-12-05 DIAGNOSIS — D122 Benign neoplasm of ascending colon: Secondary | ICD-10-CM | POA: Diagnosis not present

## 2017-12-05 DIAGNOSIS — R011 Cardiac murmur, unspecified: Secondary | ICD-10-CM | POA: Diagnosis not present

## 2017-12-05 DIAGNOSIS — G4730 Sleep apnea, unspecified: Secondary | ICD-10-CM | POA: Insufficient documentation

## 2017-12-05 DIAGNOSIS — E785 Hyperlipidemia, unspecified: Secondary | ICD-10-CM | POA: Insufficient documentation

## 2017-12-05 DIAGNOSIS — Z888 Allergy status to other drugs, medicaments and biological substances status: Secondary | ICD-10-CM | POA: Diagnosis not present

## 2017-12-05 DIAGNOSIS — K5730 Diverticulosis of large intestine without perforation or abscess without bleeding: Secondary | ICD-10-CM | POA: Diagnosis not present

## 2017-12-05 DIAGNOSIS — Z7984 Long term (current) use of oral hypoglycemic drugs: Secondary | ICD-10-CM | POA: Insufficient documentation

## 2017-12-05 DIAGNOSIS — Z9989 Dependence on other enabling machines and devices: Secondary | ICD-10-CM | POA: Insufficient documentation

## 2017-12-05 DIAGNOSIS — Z8719 Personal history of other diseases of the digestive system: Secondary | ICD-10-CM | POA: Diagnosis not present

## 2017-12-05 DIAGNOSIS — Z7982 Long term (current) use of aspirin: Secondary | ICD-10-CM | POA: Diagnosis not present

## 2017-12-05 DIAGNOSIS — Z79899 Other long term (current) drug therapy: Secondary | ICD-10-CM | POA: Diagnosis not present

## 2017-12-05 DIAGNOSIS — E119 Type 2 diabetes mellitus without complications: Secondary | ICD-10-CM | POA: Diagnosis not present

## 2017-12-05 DIAGNOSIS — D123 Benign neoplasm of transverse colon: Secondary | ICD-10-CM

## 2017-12-05 DIAGNOSIS — N281 Cyst of kidney, acquired: Secondary | ICD-10-CM | POA: Diagnosis not present

## 2017-12-05 DIAGNOSIS — I10 Essential (primary) hypertension: Secondary | ICD-10-CM | POA: Diagnosis not present

## 2017-12-05 DIAGNOSIS — K625 Hemorrhage of anus and rectum: Secondary | ICD-10-CM

## 2017-12-05 HISTORY — PX: COLONOSCOPY WITH PROPOFOL: SHX5780

## 2017-12-05 LAB — GLUCOSE, CAPILLARY: Glucose-Capillary: 102 mg/dL — ABNORMAL HIGH (ref 65–99)

## 2017-12-05 SURGERY — COLONOSCOPY WITH PROPOFOL
Anesthesia: General

## 2017-12-05 MED ORDER — SODIUM CHLORIDE 0.9 % IV SOLN
INTRAVENOUS | Status: DC
  Administered 2017-12-05: 09:00:00 via INTRAVENOUS

## 2017-12-05 MED ORDER — LIDOCAINE HCL (PF) 2 % IJ SOLN
INTRAMUSCULAR | Status: AC
  Filled 2017-12-05: qty 10

## 2017-12-05 MED ORDER — CIPROFLOXACIN HCL 500 MG PO TABS: 500.0000 mg | ORAL_TABLET | Freq: Two times a day (BID) | ORAL | 0 refills | Status: AC

## 2017-12-05 MED ORDER — PROPOFOL 10 MG/ML IV BOLUS
INTRAVENOUS | Status: DC | PRN
  Administered 2017-12-05: 80 mg via INTRAVENOUS
  Administered 2017-12-05: 20 mg via INTRAVENOUS

## 2017-12-05 MED ORDER — LIDOCAINE 2% (20 MG/ML) 5 ML SYRINGE
INTRAMUSCULAR | Status: DC | PRN
  Administered 2017-12-05: 100 mg via INTRAVENOUS

## 2017-12-05 MED ORDER — PROPOFOL 500 MG/50ML IV EMUL
INTRAVENOUS | Status: AC
  Filled 2017-12-05: qty 50

## 2017-12-05 MED ORDER — METRONIDAZOLE 500 MG PO TABS: 500.0000 mg | ORAL_TABLET | Freq: Two times a day (BID) | ORAL | 0 refills | Status: AC

## 2017-12-05 MED ORDER — PROPOFOL 500 MG/50ML IV EMUL
INTRAVENOUS | Status: DC | PRN
  Administered 2017-12-05: 150 ug/kg/min via INTRAVENOUS

## 2017-12-05 NOTE — Anesthesia Preprocedure Evaluation (Signed)
Anesthesia Evaluation  Patient identified by MRN, date of birth, ID band Patient awake    Reviewed: Allergy & Precautions, NPO status , Patient's Chart, lab work & pertinent test results  History of Anesthesia Complications Negative for: history of anesthetic complications  Airway Mallampati: II       Dental   Pulmonary sleep apnea and Continuous Positive Airway Pressure Ventilation , neg COPD,           Cardiovascular hypertension, Pt. on medications + CAD  (-) Past MI and (-) CHF (-) dysrhythmias + Valvular Problems/Murmurs (s/p ASD repair)      Neuro/Psych neg Seizures    GI/Hepatic Neg liver ROS, neg GERD  ,  Endo/Other  diabetes, Type 2, Oral Hypoglycemic Agents  Renal/GU Renal disease (cysts)     Musculoskeletal   Abdominal   Peds  Hematology   Anesthesia Other Findings   Reproductive/Obstetrics                            Anesthesia Physical Anesthesia Plan  ASA: III  Anesthesia Plan: General   Post-op Pain Management:    Induction: Intravenous  PONV Risk Score and Plan: 2 and TIVA and Propofol infusion  Airway Management Planned: Nasal Cannula  Additional Equipment:   Intra-op Plan:   Post-operative Plan:   Informed Consent: I have reviewed the patients History and Physical, chart, labs and discussed the procedure including the risks, benefits and alternatives for the proposed anesthesia with the patient or authorized representative who has indicated his/her understanding and acceptance.     Plan Discussed with:   Anesthesia Plan Comments:         Anesthesia Quick Evaluation

## 2017-12-05 NOTE — Op Note (Signed)
[facility] Gastroenterology Patient Name: Ernest Pennington Procedure Date: 12/05/2017 10:12 AM MRN: [id number] Account #: [id number] Date of Birth: 02-02-1956 Admit Type: Outpatient Age: 62 Room: [facility] ENDO ROOM 4 Gender: Male Note Status: Finalized Procedure:            Colonoscopy Indications:          High risk colon cancer surveillance: Personal history                        of colonic polyps, Surveillance: Personal history of                        colonic polyps (unknown histology) on last colonoscopy                        more than 5 years ago, Last colonoscopy: October 2013 Providers:            Lin Landsman MD, MD Referring MD:         Kerin Perna MD, MD (Referring MD) Medicines:            Monitored Anesthesia Care Complications:        No immediate complications. Estimated blood loss: None. Procedure:            Pre-Anesthesia Assessment:                       - Prior to the procedure, a History and Physical was                        performed, and patient medications and allergies were                        reviewed. The patient is competent. The risks and                        benefits of the procedure and the sedation options and                        risks were discussed with the patient. All questions                        were answered and informed consent was obtained.                        Patient identification and proposed procedure were                        verified by the physician, the nurse, the                        anesthesiologist, the anesthetist and the technician in                        the pre-procedure area in the procedure room in the                        endoscopy suite. Mental Status Examination: alert and                        oriented. Airway  Examination: normal oropharyngeal                        airway and neck mobility. Respiratory Examination:                        clear to auscultation. CV  Examination: normal.                        Prophylactic Antibiotics: The patient does not require                        prophylactic antibiotics. Prior Anticoagulants: The                        patient has taken aspirin, last dose was 1 day prior to                        procedure. ASA Grade Assessment: III - A patient with                        severe systemic disease. After reviewing the risks and                        benefits, the patient was deemed in satisfactory                        condition to undergo the procedure. The anesthesia plan                        was to use monitored anesthesia care (MAC). Immediately                        prior to administration of medications, the patient was                        re-assessed for adequacy to receive sedatives. The                        heart rate, respiratory rate, oxygen saturations, blood                        pressure, adequacy of pulmonary ventilation, and                        response to care were monitored throughout the                        procedure. The physical status of the patient was                        re-assessed after the procedure.                       After obtaining informed consent, the colonoscope was                        passed under direct vision. Throughout the procedure,                        the  patient's blood pressure, pulse, and oxygen                        saturations were monitored continuously. The                        Colonoscope was introduced through the anus and                        advanced to the the cecum, identified by appendiceal                        orifice and ileocecal valve. The colonoscopy was                        performed without difficulty. The patient tolerated the                        procedure well. The quality of the bowel preparation                        was evaluated using the BBPS [facility] Bowel Preparation                        Scale) with  scores of: Right Colon = 3, Transverse                        Colon = 3 and Left Colon = 3 (entire mucosa seen well                        with no residual staining, small fragments of stool or                        opaque liquid). The total BBPS score equals 9. Findings:      The perianal and digital rectal examinations were normal. Pertinent       negatives include normal sphincter tone and no palpable rectal lesions.      Two sessile polyps were found in the transverse colon and ascending       colon. The polyps were 5 mm in size. These polyps were removed with a       cold snare. Resection and retrieval were complete.      Multiple small and large-mouthed diverticula were found in the sigmoid       colon. There was narrowing of the colon in association with the       diverticular opening. Peri-diverticular erythema was seen. There was no       evidence of diverticular bleeding. Few scaterred diverticula in rest of       the colon      The retroflexed view of the distal rectum and anal verge was normal and       showed no anal or rectal abnormalities. Impression:           - Two 5 mm polyps in the transverse colon and in the                        ascending colon, removed with a cold snare. Resected  and retrieved.                       - Severe diverticulosis in the sigmoid colon. There was                        narrowing of the colon in association with the                        diverticular opening. Peri-diverticular erythema was                        seen. There was no evidence of diverticular bleeding.                       - The distal rectum and anal verge are normal on                        retroflexion view. Recommendation:       - Discharge patient to home.                       - Resume previous diet today.                       - Continue present medications.                       - Await pathology results.                       - Repeat  colonoscopy in 5 years for surveillance based                        on pathology results. Procedure Code(s):    --- Professional ---                       [phone], Colonoscopy, flexible; with removal of tumor(s),                        polyp(s), or other lesion(s) by snare technique Diagnosis Code(s):    --- Professional ---                       D12.3, Benign neoplasm of transverse colon (hepatic                        flexure or splenic flexure)                       D12.2, Benign neoplasm of ascending colon                       Z86.010, Personal history of colonic polyps                       K57.30, Diverticulosis of large intestine without                        perforation or abscess without bleeding CPT copyright 2017 American Medical Association. All rights reserved. The codes documented in this report are preliminary and upon coder review may  be revised to meet current  compliance requirements. Dr. Ulyess Mort Lin Landsman MD, MD 12/05/2017 10:43:18 AM This report has been signed electronically. Number of Addenda: 0 Note Initiated On: 12/05/2017 10:12 AM Scope Withdrawal Time: 0 hours 16 minutes 40 seconds  Total Procedure Duration: 0 hours 20 minutes 27 seconds       [facility]

## 2017-12-05 NOTE — Anesthesia Post-op Follow-up Note (Signed)
Anesthesia QCDR form completed.        

## 2017-12-05 NOTE — Anesthesia Postprocedure Evaluation (Signed)
Anesthesia Post Note  Patient: Ernest Pennington  Procedure(s) Performed: COLONOSCOPY WITH PROPOFOL (N/A )  Patient location during evaluation: Endoscopy Anesthesia Type: General Level of consciousness: awake and alert Pain management: pain level controlled Vital Signs Assessment: post-procedure vital signs reviewed and stable Respiratory status: spontaneous breathing and respiratory function stable Cardiovascular status: stable Anesthetic complications: no     Last Vitals:  Vitals:   12/05/17 1043 12/05/17 1044  BP: 129/83 129/83  Pulse:  82  Resp:  20  Temp: (!) 36.2 C   SpO2:  98%    Last Pain:  Vitals:   12/05/17 1053  TempSrc:   PainSc: 0-No pain                 KEPHART,WILLIAM K

## 2017-12-05 NOTE — H&P (Signed)
Ernest Darby, MD [address]  [place]  [place], [place] [address]  Main: [phone]  Fax: [phone] Pager: [phone]  Primary Care Physician:  Hortencia Pilar, MD Primary Gastroenterologist:  Dr. Cephas Pennington  Pre-Procedure History & Physical: HPI:  Ernest Pennington is a 62 y.o. male is here for an colonoscopy.   Past Medical History:  Diagnosis Date  . ATRIAL SEPTAL DEFECT   . Back pain   . CAD   . Diverticulitis   . History of rectal bleeding   . HYPERLIPIDEMIA   . HYPERTENSION   . Pre-diabetes     Past Surgical History:  Procedure Laterality Date  . FOOT SURGERY    . HERNIA REPAIR    . repair of heart defect      Prior to Admission medications   Medication Sig Start Date End Date Taking? Authorizing Provider  aspirin 81 MG tablet Take 81 mg by mouth daily.     Yes [provider]  cloNIDine (CATAPRES) 0.1 MG tablet Take 0.1 mg by mouth 2 (two) times daily.   Yes [provider]  diltiazem (CARDIZEM CD) 240 MG 24 hr capsule Take by mouth. 06/24/17 06/24/18 Yes [provider]  ibuprofen (ADVIL,MOTRIN) 200 MG tablet Take by mouth every 6 (six) hours as needed.   Yes [provider]  losartan (COZAAR) 100 MG tablet  09/09/17  Yes [provider]  metFORMIN (GLUCOPHAGE) 500 MG tablet  09/10/17  Yes [provider]  spironolactone (ALDACTONE) 50 MG tablet  09/19/17  Yes [provider]  traZODone (DESYREL) 100 MG tablet Take 100 mg by mouth. 09/13/14  Yes [provider]  Acetaminophen (MAPAP) 500 MG coapsule Take by mouth.    [provider]  allopurinol (ZYLOPRIM) 300 MG tablet Take 300 mg by mouth daily.      [provider]  amoxicillin-clavulanate (AUGMENTIN) 875-125 MG tablet TAKE 1 TABLET BY MOUTH TWICE A DAY FOR 7 DAYS 10/26/17   [provider]  celecoxib (CELEBREX) 200 MG capsule Take 200 mg by mouth daily.    [provider]  cloNIDine  (CATAPRES) 0.2 MG tablet  09/09/17   [provider]  diltiazem (DILACOR XR) 240 MG 24 hr capsule Take 240 mg by mouth daily.      [provider]  diltiazem (TIAZAC) 240 MG 24 hr capsule  09/13/14   [provider]  docusate sodium (COLACE) 100 MG capsule Take by mouth.    [provider]  ezetimibe (ZETIA) 10 MG tablet Take 10 mg by mouth daily.      [provider]  hydrALAZINE (APRESOLINE) 100 MG tablet Take 50 mg by mouth. 09/13/14   [provider]  losartan-hydrochlorothiazide (HYZAAR) 100-25 MG per tablet Take 1 tablet by mouth daily.    [provider]  Nebivolol HCl (BYSTOLIC) 20 MG TABS Take 1 tablet by mouth daily.    [provider]  omega-3 acid ethyl esters (LOVAZA) 1 G capsule Take 1 g by mouth 2 (two) times daily.    [provider]  polyethylene glycol (MIRALAX / GLYCOLAX) packet Take by mouth. 07/22/17   [provider]  spironolactone (ALDACTONE) 25 MG tablet     [provider]    Allergies as of 11/19/2017 - Review Complete 08/16/2011  Allergen Reaction Noted  . Amlodipine Anaphylaxis 10/23/2011  . Furosemide Swelling 10/23/2011  . Tamsulosin Swelling 09/07/2015  . Amlodipine besy-benazepril hcl    . Amlodipine besy-benazepril  hcl  12/06/2014  . Atenolol Other (See Comments) 10/23/2011  . Statins Other (See Comments) 09/23/2015    History reviewed. No pertinent family history.  Social History   Socioeconomic History  . Marital status: Married    Spouse name: Not on file  . Number of children: Not on file  . Years of education: Not on file  . Highest education level: Not on file  Occupational History  . Not on file  Social Needs  . Financial resource strain: Not on file  . Food insecurity:    Worry: Not on file    Inability: Not on file  . Transportation needs:    Medical: Not on file    Non-medical: Not on file  Tobacco Use  . Smoking status: Never Smoker  .  Smokeless tobacco: Never Used  Substance and Sexual Activity  . Alcohol use: Not Currently  . Drug use: Never  . Sexual activity: Yes    Partners: Female  Lifestyle  . Physical activity:    Days per week: Not on file    Minutes per session: Not on file  . Stress: Not on file  Relationships  . Social connections:    Talks on phone: Not on file    Gets together: Not on file    Attends religious service: Not on file    Active member of club or organization: Not on file    Attends meetings of clubs or organizations: Not on file    Relationship status: Not on file  . Intimate partner violence:    Fear of current or ex partner: Not on file    Emotionally abused: Not on file    Physically abused: Not on file    Forced sexual activity: Not on file  Other Topics Concern  . Not on file  Social History Narrative  . Not on file    Review of Systems: See HPI, otherwise negative ROS  Physical Exam: BP (!) 181/124   Pulse 91   Temp 98.7 F (37.1 C) (Tympanic)   Resp 16   Ht 5\' 10"  (1.778 m)   Wt 232 lb (105.2 kg)   SpO2 99%   BMI 33.29 kg/m  General:   Alert,  pleasant and cooperative in NAD Head:  Normocephalic and atraumatic. Neck:  Supple; no masses or thyromegaly. Lungs:  Clear throughout to auscultation.    Heart:  Regular rate and rhythm. Abdomen:  Soft, nontender and nondistended. Normal bowel sounds, without guarding, and without rebound.   Neurologic:  Alert and  oriented x4;  grossly normal neurologically.  Impression/Plan: Ernest Pennington is here for an colonoscopy to be performed for colon cancer/polyps surveillance  Risks, benefits, limitations, and alternatives regarding  colonoscopy have been reviewed with the patient.  Questions have been answered.  All parties agreeable.   Sherri Sear, MD  12/05/2017, 9:56 AM

## 2017-12-05 NOTE — Transfer of Care (Signed)
Immediate Anesthesia Transfer of Care Note  Patient: Ernest Pennington  Procedure(s) Performed: COLONOSCOPY WITH PROPOFOL (N/A )  Patient Location: Endoscopy Unit  Anesthesia Type:General  Level of Consciousness: awake, alert  and oriented  Airway & Oxygen Therapy: Patient connected to nasal cannula oxygen  Post-op Assessment: Post -op Vital signs reviewed and stable  Post vital signs: stable  Last Vitals:  Vitals Value Taken Time  BP 129/83 12/05/2017 10:44 AM  Temp 36.2 C 12/05/2017 10:43 AM  Pulse 82 12/05/2017 10:44 AM  Resp 20 12/05/2017 10:44 AM  SpO2 98 % 12/05/2017 10:44 AM    Last Pain:  Vitals:   12/05/17 1043  TempSrc: Tympanic  PainSc: Asleep         Complications: No apparent anesthesia complications

## 2017-12-06 ENCOUNTER — Encounter: Payer: Self-pay | Admitting: Gastroenterology

## 2017-12-06 LAB — SURGICAL PATHOLOGY

## 2017-12-10 ENCOUNTER — Encounter: Payer: Self-pay | Admitting: Gastroenterology

## 2017-12-16 ENCOUNTER — Encounter: Payer: Self-pay | Admitting: Gastroenterology

## 2017-12-17 ENCOUNTER — Other Ambulatory Visit: Payer: Self-pay

## 2018-01-02 ENCOUNTER — Other Ambulatory Visit: Payer: Self-pay | Admitting: Surgery

## 2018-01-02 DIAGNOSIS — K5792 Diverticulitis of intestine, part unspecified, without perforation or abscess without bleeding: Secondary | ICD-10-CM

## 2018-02-03 ENCOUNTER — Ambulatory Visit
Admission: RE | Admit: 2018-02-03 | Discharge: 2018-02-03 | Disposition: A | Discharge status: Home / Self Care | Payer: 59 | Source: Ambulatory Visit | Attending: Surgery | Admitting: Surgery

## 2018-02-03 DIAGNOSIS — K5792 Diverticulitis of intestine, part unspecified, without perforation or abscess without bleeding: Secondary | ICD-10-CM

## 2018-02-13 ENCOUNTER — Ambulatory Visit
Admission: RE | Admit: 2018-02-13 | Discharge: 2018-02-13 | Disposition: A | Discharge status: Home / Self Care | Payer: 59 | Source: Ambulatory Visit | Attending: Surgery | Admitting: Surgery

## 2018-02-13 DIAGNOSIS — K5792 Diverticulitis of intestine, part unspecified, without perforation or abscess without bleeding: Secondary | ICD-10-CM | POA: Diagnosis present

## 2018-02-13 DIAGNOSIS — K5730 Diverticulosis of large intestine without perforation or abscess without bleeding: Secondary | ICD-10-CM | POA: Diagnosis not present

## 2018-02-18 ENCOUNTER — Telehealth: Payer: Self-pay | Admitting: Gastroenterology

## 2018-02-18 NOTE — Telephone Encounter (Signed)
Ernest Pennington from [facility] [place] [facility] left vm for Ernest Pennington  She states Ernest Pennington requested apt for pt and she already faxed the information  Over, any questions call [phone]

## 2018-06-06 ENCOUNTER — Other Ambulatory Visit: Payer: Self-pay

## 2018-06-06 ENCOUNTER — Ambulatory Visit
Admission: EM | Admit: 2018-06-06 | Discharge: 2018-06-06 | Disposition: A | Discharge status: Other Acute Inpatient Hospital | Payer: Worker's Compensation | Attending: Family Medicine | Admitting: Family Medicine

## 2018-06-06 ENCOUNTER — Encounter: Payer: Self-pay | Admitting: Emergency Medicine

## 2018-06-06 DIAGNOSIS — S61210A Laceration without foreign body of right index finger without damage to nail, initial encounter: Secondary | ICD-10-CM

## 2018-06-06 DIAGNOSIS — W319XXA Contact with unspecified machinery, initial encounter: Secondary | ICD-10-CM

## 2018-06-06 NOTE — ED Provider Notes (Signed)
[facility]    CSN: [id number] Arrival date & time: 06/06/18  1218     History   Chief Complaint Chief Complaint  Patient presents with  . Laceration    W/C    HPI Ernest Pennington is a 62 y.o. male.   62 yo male with a c/o laceration to right index finger about 1 hour ago while grinding a metal plate. Last tetanus vaccine in 2018. Patient is diabetic and also takes plavix plus aspirin for CAD.   The history is provided by the patient.  Laceration    Past Medical History:  Diagnosis Date  . ATRIAL SEPTAL DEFECT   . Back pain   . CAD   . Diverticulitis   . History of rectal bleeding   . HYPERLIPIDEMIA   . HYPERTENSION   . Pre-diabetes     Patient Active Problem List   Diagnosis Date Noted  . History of colonic polyps   . Blood glucose elevated 11/18/2017  . Gout 05/21/2016  . Malignant neoplasm of prostate ([place]) 04/26/2016  . Benign prostatic hyperplasia with urinary obstruction 04/07/2015  . Incomplete bladder emptying 04/07/2015  . Elevated prostate specific antigen (PSA) 03/15/2015  . Cannot sleep 12/06/2014  . Family history of malignant neoplasm of prostate 12/06/2014  . History of atrial septal defect repair 12/06/2014  . Type 2 diabetes mellitus ([place]) 12/06/2014  . Hernia, inguinal, right 10/25/2014  . Kidney cysts 10/25/2014  . Apnea, sleep 01/05/2013  . Fatty infiltration of liver 09/08/2012  . Breath shortness 12/07/2011  . ATRIAL SEPTAL DEFECT 06/03/2009  . HYPERLIPIDEMIA 06/02/2009  . HYPERTENSION 06/02/2009  . CAD 06/02/2009    Past Surgical History:  Procedure Laterality Date  . COLONOSCOPY WITH PROPOFOL N/A 12/05/2017   Procedure: COLONOSCOPY WITH PROPOFOL;  Surgeon: Lin Landsman, MD;  Location: [facility] ENDOSCOPY;  Service: Gastroenterology;  Laterality: N/A;  . FOOT SURGERY    . HERNIA REPAIR    . repair of heart defect         Home Medications    Prior to Admission medications   Medication Sig Start Date  End Date Taking? Authorizing Provider  Acetaminophen (MAPAP) 500 MG coapsule Take by mouth.   Yes [provider]  allopurinol (ZYLOPRIM) 300 MG tablet Take 300 mg by mouth daily.     Yes [provider]  aspirin 81 MG tablet Take 81 mg by mouth daily.     Yes [provider]  celecoxib (CELEBREX) 200 MG capsule Take 200 mg by mouth daily.   Yes [provider]  clopidogrel (PLAVIX) 75 MG tablet Take by mouth. 01/09/18 01/30/19 Yes [provider]  diltiazem (CARDIZEM CD) 240 MG 24 hr capsule Take by mouth. 06/24/17 06/24/18 Yes [provider]  docusate sodium (COLACE) 100 MG capsule Take by mouth.   Yes [provider]  hydrALAZINE (APRESOLINE) 100 MG tablet Take 50 mg by mouth. 09/13/14  Yes [provider]  ibuprofen (ADVIL,MOTRIN) 200 MG tablet Take by mouth every 6 (six) hours as needed.   Yes [provider]  losartan (COZAAR) 100 MG tablet  09/09/17  Yes [provider]  metFORMIN (GLUCOPHAGE) 500 MG tablet  09/10/17  Yes [provider]  Nebivolol HCl (BYSTOLIC) 20 MG TABS Take 1 tablet by mouth daily.   Yes [provider]  polyethylene glycol (MIRALAX / GLYCOLAX) packet Take by mouth. 07/22/17  Yes [provider]  spironolactone (ALDACTONE) 25 MG tablet    Yes [provider]  traZODone (DESYREL) 100 MG tablet Take 100 mg by mouth. 09/13/14  Yes [provider]  cloNIDine (CATAPRES) 0.1 MG tablet Take 0.1 mg by mouth 2 (two) times daily.    [provider]  cloNIDine (CATAPRES) 0.2 MG tablet  09/09/17   [provider]  diltiazem (DILACOR XR) 240 MG 24 hr capsule Take 240 mg by mouth daily.      [provider]  diltiazem (TIAZAC) 240 MG 24 hr capsule  09/13/14   [provider]  ezetimibe (ZETIA) 10 MG tablet Take 10 mg by mouth daily.      [provider]  losartan-hydrochlorothiazide (HYZAAR) 100-25 MG per tablet Take  1 tablet by mouth daily.    [provider]  omega-3 acid ethyl esters (LOVAZA) 1 G capsule Take 1 g by mouth 2 (two) times daily.    [provider]  spironolactone (ALDACTONE) 50 MG tablet  09/19/17   [provider]    Family History History reviewed. No pertinent family history.  Social History Social History   Tobacco Use  . Smoking status: Former Research scientist (life sciences)  . Smokeless tobacco: Never Used  Substance Use Topics  . Alcohol use: Not Currently  . Drug use: Never     Allergies   Amlodipine; Furosemide; Tamsulosin; Amlodipine besy-benazepril hcl; Amlodipine besy-benazepril hcl; Atenolol; and Statins   Review of Systems Review of Systems   Physical Exam Triage Vital Signs ED Triage Vitals  Enc Vitals Group     BP 06/06/18 1245 (!) 163/97     Pulse Rate 06/06/18 1245 75     Resp 06/06/18 1245 16     Temp 06/06/18 1245 98 F (36.7 C)     Temp Source 06/06/18 1245 Oral     SpO2 06/06/18 1245 97 %     Weight 06/06/18 1238 232 lb (105.2 kg)     Height 06/06/18 1238 5' 10.5" (1.791 m)     Head Circumference --      Peak Flow --      Pain Score 06/06/18 1238 4     Pain Loc --      Pain Edu? --      Excl. in [place]? --    No data found.  Updated Vital Signs BP (!) 163/97 (BP Location: Left Arm)   Pulse 75   Temp 98 F (36.7 C) (Oral)   Resp 16   Ht 5' 10.5" (1.791 m)   Wt 105.2 kg   SpO2 97%   BMI 32.82 kg/m   Visual Acuity Right Eye Distance:   Left Eye Distance:   Bilateral Distance:    Right Eye Near:   Left Eye Near:    Bilateral Near:     Physical Exam  Constitutional: He appears well-developed and well-nourished. No distress.  Musculoskeletal:       Right hand: He exhibits tenderness, bony tenderness and laceration (over dorsum of right index finger; bone visible on examination; normal range of motion; decreased extension of finger). He exhibits normal range of motion and normal capillary refill. Normal sensation noted.  Decreased strength (as described above to right index finger) noted.  Skin: He is not diaphoretic.  Nursing note and vitals reviewed.    UC Treatments / Results  Labs (all labs ordered are listed, but only abnormal results are displayed) Labs Reviewed - No data to display  EKG None  Radiology No results found.  Procedures Procedures (including critical care time)  Medications Ordered in  UC Medications - No data to display  Initial Impression / Assessment and Plan / UC Course  I have reviewed the triage vital signs and the nursing notes.  Pertinent labs & imaging results that were available during my care of the patient were reviewed by me and considered in my medical decision making (see chart for details).      Final Clinical Impressions(s) / UC Diagnoses   Final diagnoses:  Laceration of right index finger, foreign body presence unspecified, nail damage status unspecified, initial encounter  (likely open fracture; question of tendon/ligament involvement)   Discharge Instructions     Recommend patient go to Emergency Department for further evaluation and management    ED Prescriptions    None     1. Diagnosis and possible complications reviewed with patient 2. Recommend patient go to Emergency Department for further evaluation and management; wound cleaned and pressure dressing applied.    Controlled Substance Prescriptions [place] Controlled Substance Registry consulted? Not Applicable   Norval Gable, MD 06/06/18 1331

## 2018-06-06 NOTE — Discharge Instructions (Signed)
Recommend patient go to Emergency Department for further evaluation and management °

## 2018-06-06 NOTE — ED Triage Notes (Signed)
Pt here today he was grinding a metal plate and cut his right index finger. He was at his job at [employer]. Tdap 11/12/16

## 2019-09-30 ENCOUNTER — Other Ambulatory Visit: Payer: Self-pay | Admitting: Orthopedic Surgery

## 2019-09-30 DIAGNOSIS — M25552 Pain in left hip: Secondary | ICD-10-CM

## 2019-09-30 DIAGNOSIS — Z1389 Encounter for screening for other disorder: Secondary | ICD-10-CM

## 2019-10-05 ENCOUNTER — Other Ambulatory Visit: Payer: Self-pay | Admitting: Orthopedic Surgery

## 2019-10-05 DIAGNOSIS — M25552 Pain in left hip: Secondary | ICD-10-CM

## 2019-10-26 ENCOUNTER — Other Ambulatory Visit: Payer: Self-pay

## 2019-10-26 ENCOUNTER — Ambulatory Visit
Admission: RE | Admit: 2019-10-26 | Discharge: 2019-10-26 | Disposition: A | Discharge status: Home / Self Care | Payer: 59 | Source: Ambulatory Visit | Attending: Orthopedic Surgery | Admitting: Orthopedic Surgery

## 2019-10-26 DIAGNOSIS — M25552 Pain in left hip: Secondary | ICD-10-CM

## 2020-03-11 IMAGING — CR DG BE W/ AIR HIGH DENSITY
2 series · 10 of 10 positions shown · non-contrast
Comparison: CT abdomen 07/19/2014, 10/08/2014, 11/29/2015

CLINICAL DATA: History of prostate cancer.  History diverticulitis.

EXAM:
AIR CONTRAST BARIUM ENEMA
TECHNIQUE: Initial scout AP supine abdominal image obtained to insure adequate
colon cleansing. Barium was introduced into the colon in a
retrograde fashion and refluxed from the rectum to the distal
transverse colon. As much of the barium as possible was then removed
through the indwelling tube via gravity drain. Air was then
insufflated into the colon. Spot images of the colon followed by
overhead radiographs were obtained.
FLUOROSCOPY TIME:  Fluoroscopy Time:  1 minutes 36 seconds
Radiation Exposure Index (if provided by the fluoroscopic device):
27.2 mGy
Number of Acquired Spot Images: 16

[Series 1: t abdomen supine · 0.14mm/px · 5 of 18 slices shown]
[im 1/18]
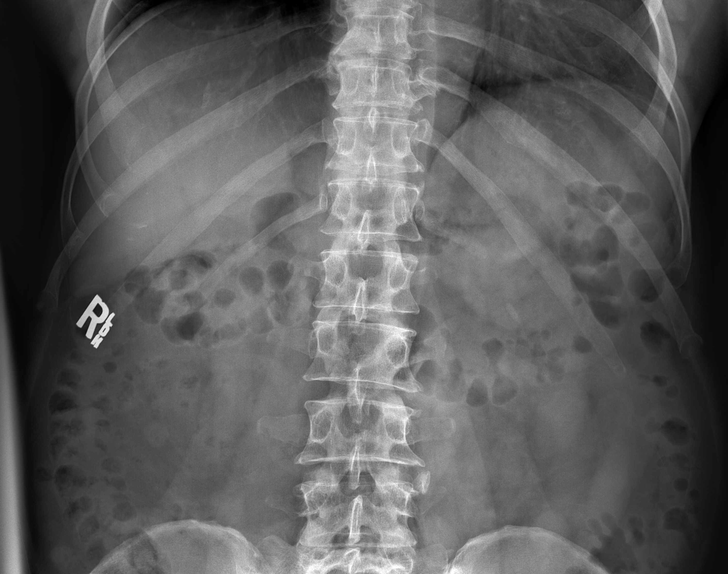
[im 5/18]
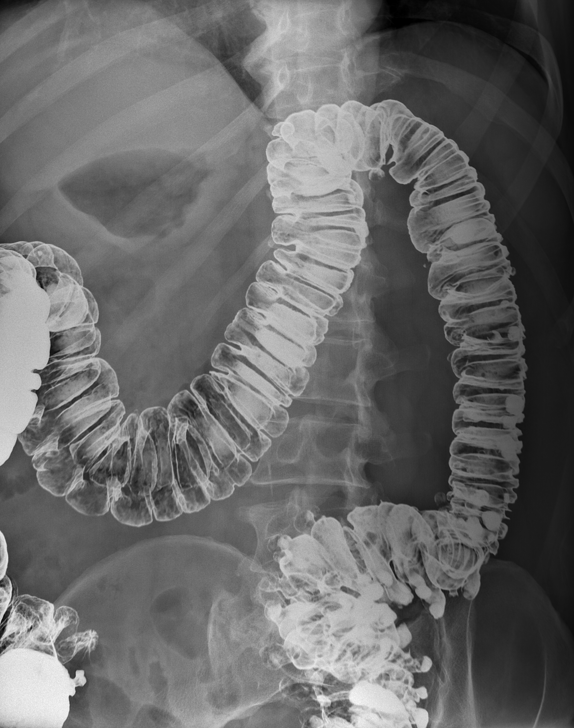
[im 9/18]
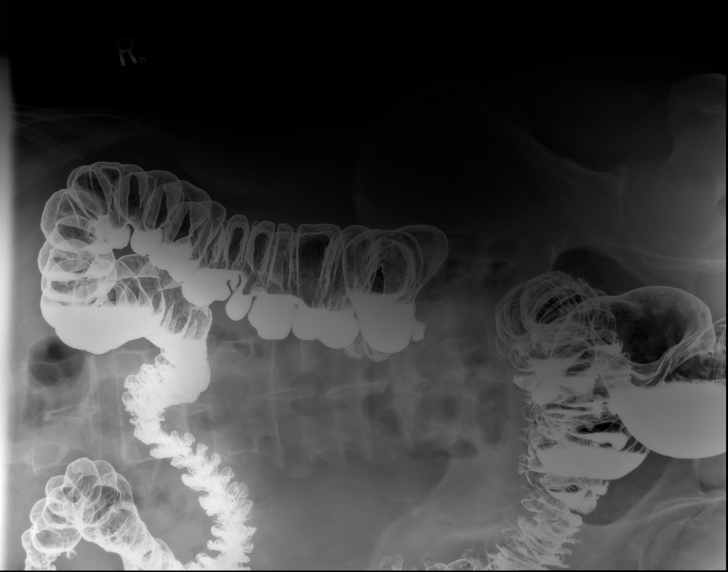
[im 13/18]
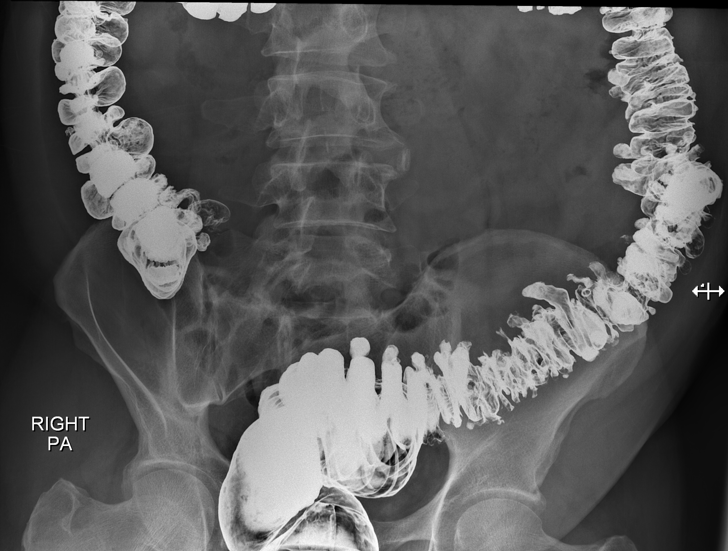
[im 18/18]
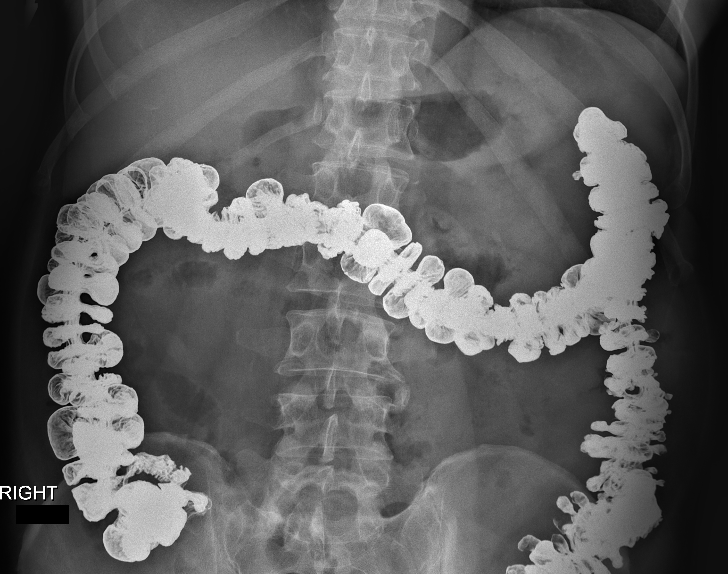

[Series 3: cp_standard · 0.26mm/px · 5 of 15 slices shown]
[im 1/15]
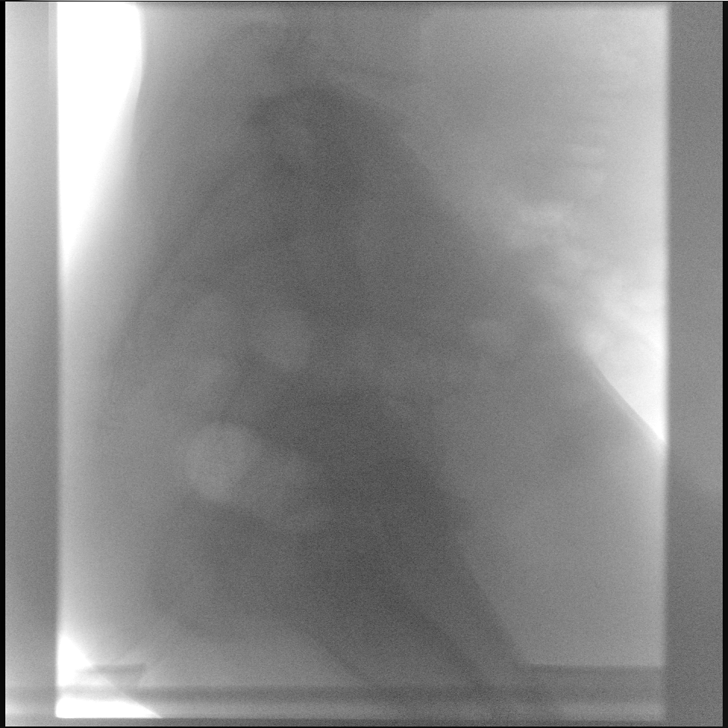
[im 4/15]
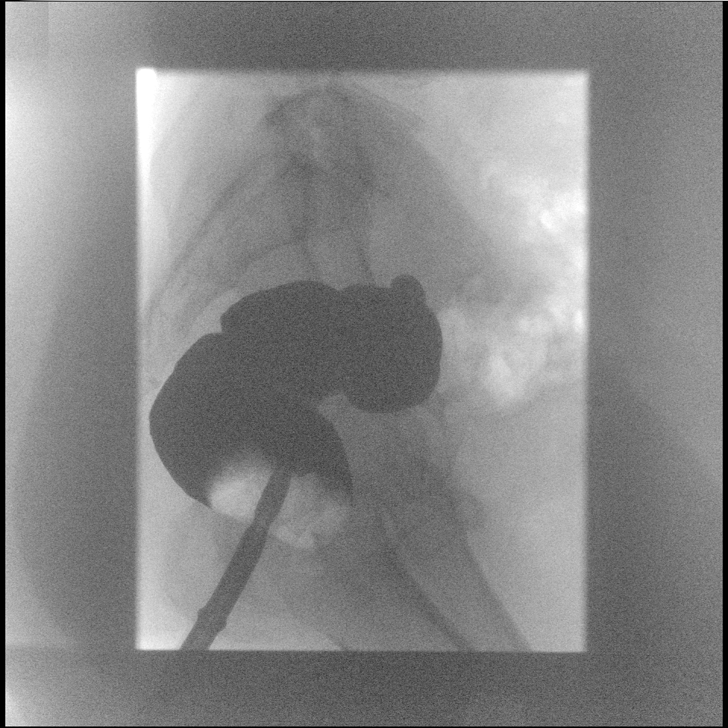
[im 8/15]
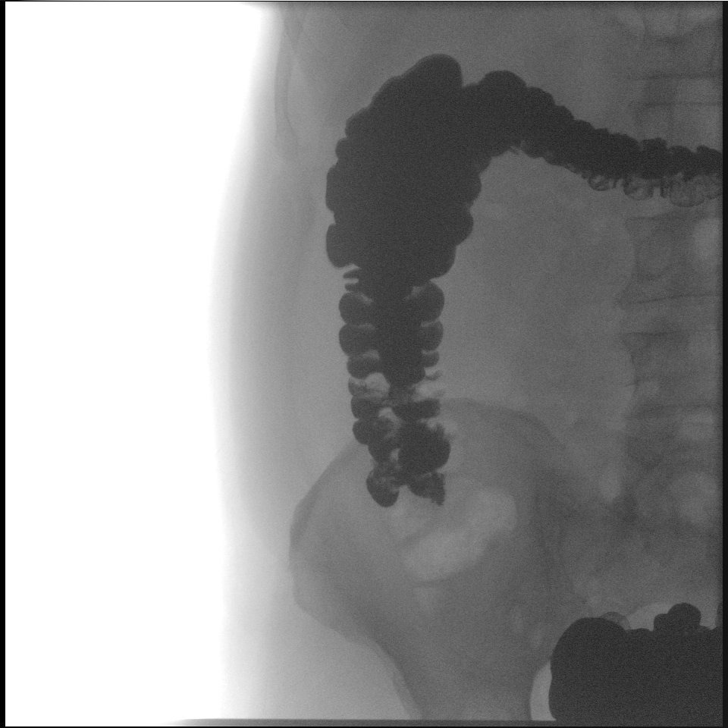
[im 11/15]
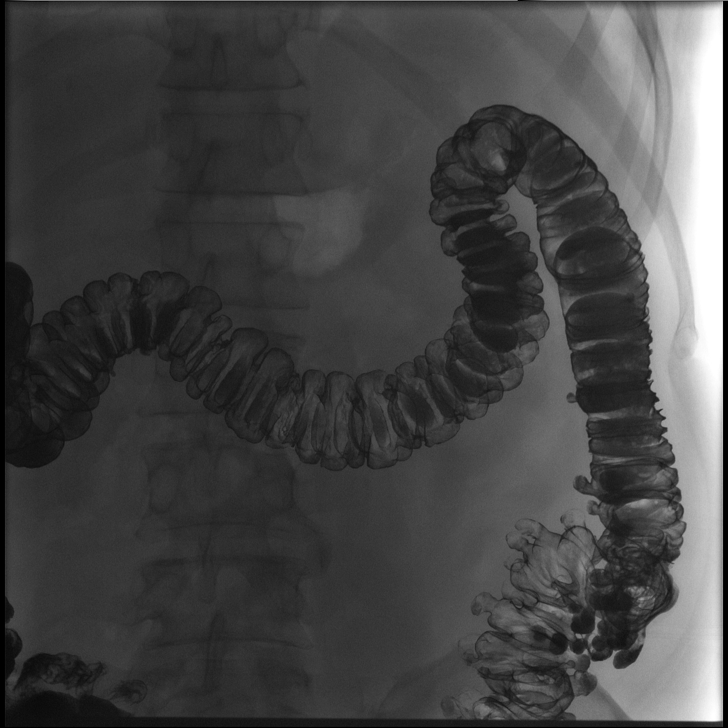
[im 15/15]
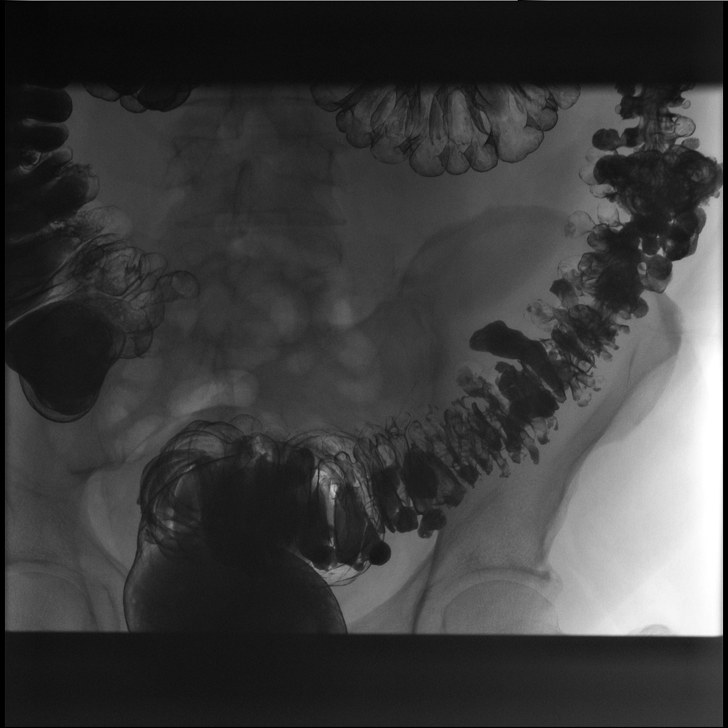

[10 of 10 positions shown; findings below may reference images not displayed]

FINDINGS: An enema catheter was inserted and retention balloon distended under
fluoroscopy. Barium followed by air was then introduced with
satisfactory demonstration of the colon.

The descending colon-sigmoid colon junction is limited evaluation
secondary to difficulty in achieving adequate distension secondary
to patient discomfort. Examination was negative for filling defects
to indicate the presence of a polyp or mass lesion. There was no
evidence of colonic stricture or obstruction. The mucosal pattern
was otherwise normal. No tethering, inflammatory changes, or
definite ulcerations identified. Multiple uncomplicated diverticula
of the descending and sigmoid colon most severe in the sigmoid
colon. No contrast extravasation to suggest perforation. No bowel
wall thickening.
IMPRESSION: Descending colon-sigmoid colon junction is limited evaluation
secondary to difficulty in achieving adequate distension secondary
to patient discomfort. Alternatively difficulty in achieving
adequate distention may be secondary to chronic scarring from prior
episode of diverticulitis.

Multiple uncomplicated diverticula of the descending and sigmoid
colon most severe in the sigmoid colon. No contrast extravasation to
suggest perforation. No bowel wall thickening.

## 2020-05-10 ENCOUNTER — Other Ambulatory Visit: Payer: Self-pay | Admitting: Physician Assistant

## 2020-05-10 DIAGNOSIS — M25552 Pain in left hip: Secondary | ICD-10-CM

## 2020-05-10 DIAGNOSIS — M5116 Intervertebral disc disorders with radiculopathy, lumbar region: Secondary | ICD-10-CM

## 2020-05-26 ENCOUNTER — Other Ambulatory Visit: Payer: Self-pay

## 2020-05-26 ENCOUNTER — Ambulatory Visit
Admission: RE | Admit: 2020-05-26 | Discharge: 2020-05-26 | Disposition: A | Discharge status: Home / Self Care | Payer: 59 | Source: Ambulatory Visit | Attending: Physician Assistant | Admitting: Physician Assistant

## 2020-05-26 DIAGNOSIS — M5116 Intervertebral disc disorders with radiculopathy, lumbar region: Secondary | ICD-10-CM | POA: Insufficient documentation

## 2020-05-26 DIAGNOSIS — M25552 Pain in left hip: Secondary | ICD-10-CM | POA: Diagnosis present

## 2020-06-27 DIAGNOSIS — G8929 Other chronic pain: Secondary | ICD-10-CM | POA: Insufficient documentation

## 2020-06-27 DIAGNOSIS — M5442 Lumbago with sciatica, left side: Secondary | ICD-10-CM | POA: Insufficient documentation

## 2021-07-24 DIAGNOSIS — I480 Paroxysmal atrial fibrillation: Secondary | ICD-10-CM | POA: Insufficient documentation

## 2021-08-10 ENCOUNTER — Other Ambulatory Visit
Admission: RE | Admit: 2021-08-10 | Discharge: 2021-08-10 | Disposition: A | Discharge status: Home / Self Care | Payer: 59 | Source: Ambulatory Visit | Attending: Cardiology | Admitting: Cardiology

## 2021-08-10 DIAGNOSIS — R9439 Abnormal result of other cardiovascular function study: Secondary | ICD-10-CM | POA: Insufficient documentation

## 2021-08-10 DIAGNOSIS — I2510 Atherosclerotic heart disease of native coronary artery without angina pectoris: Secondary | ICD-10-CM | POA: Insufficient documentation

## 2021-08-10 LAB — BRAIN NATRIURETIC PEPTIDE: B Natriuretic Peptide: 27.4 pg/mL (ref 0.0–100.0)

## 2021-08-18 ENCOUNTER — Ambulatory Visit
Admission: RE | Admit: 2021-08-18 | Discharge: 2021-08-18 | Disposition: A | Discharge status: Home / Self Care | Payer: 59 | Attending: Cardiology | Admitting: Cardiology

## 2021-08-18 ENCOUNTER — Encounter: Payer: Self-pay | Admitting: Cardiology

## 2021-08-18 ENCOUNTER — Other Ambulatory Visit: Payer: Self-pay

## 2021-08-18 ENCOUNTER — Encounter
Admission: RE | Disposition: A | Discharge status: Home / Self Care | Payer: Self-pay | Source: Home / Self Care | Attending: Cardiology

## 2021-08-18 DIAGNOSIS — I25110 Atherosclerotic heart disease of native coronary artery with unstable angina pectoris: Secondary | ICD-10-CM | POA: Insufficient documentation

## 2021-08-18 DIAGNOSIS — E785 Hyperlipidemia, unspecified: Secondary | ICD-10-CM | POA: Diagnosis not present

## 2021-08-18 DIAGNOSIS — Z7982 Long term (current) use of aspirin: Secondary | ICD-10-CM | POA: Insufficient documentation

## 2021-08-18 DIAGNOSIS — I10 Essential (primary) hypertension: Secondary | ICD-10-CM | POA: Insufficient documentation

## 2021-08-18 DIAGNOSIS — R9439 Abnormal result of other cardiovascular function study: Secondary | ICD-10-CM

## 2021-08-18 DIAGNOSIS — G4733 Obstructive sleep apnea (adult) (pediatric): Secondary | ICD-10-CM | POA: Diagnosis not present

## 2021-08-18 DIAGNOSIS — I2582 Chronic total occlusion of coronary artery: Secondary | ICD-10-CM | POA: Diagnosis not present

## 2021-08-18 DIAGNOSIS — I200 Unstable angina: Secondary | ICD-10-CM

## 2021-08-18 DIAGNOSIS — Z8673 Personal history of transient ischemic attack (TIA), and cerebral infarction without residual deficits: Secondary | ICD-10-CM | POA: Insufficient documentation

## 2021-08-18 DIAGNOSIS — E119 Type 2 diabetes mellitus without complications: Secondary | ICD-10-CM | POA: Insufficient documentation

## 2021-08-18 HISTORY — PX: LEFT HEART CATH AND CORONARY ANGIOGRAPHY: CATH118249

## 2021-08-18 LAB — GLUCOSE, CAPILLARY: Glucose-Capillary: 160 mg/dL — ABNORMAL HIGH (ref 70–99)

## 2021-08-18 SURGERY — LEFT HEART CATH AND CORONARY ANGIOGRAPHY
Anesthesia: Moderate Sedation

## 2021-08-18 MED ORDER — SITAGLIPTIN PHOSPHATE 50 MG PO TABS
50.0000 mg | ORAL_TABLET | Freq: Every day | ORAL | 3 refills | Status: AC
Start: 2021-08-18 — End: ?

## 2021-08-18 MED ORDER — LIDOCAINE HCL (PF) 1 % IJ SOLN
INTRAMUSCULAR | Status: DC | PRN
  Administered 2021-08-18: 2 mL

## 2021-08-18 MED ORDER — DILTIAZEM HCL ER COATED BEADS 240 MG PO CP24: 240.0000 mg | ORAL_CAPSULE | Freq: Every day | ORAL | 11 refills | Status: AC

## 2021-08-18 MED ORDER — SODIUM CHLORIDE 0.9% FLUSH: 3.0000 mL | INTRAVENOUS | Status: DC | PRN

## 2021-08-18 MED ORDER — MIDAZOLAM HCL 2 MG/2ML IJ SOLN
INTRAMUSCULAR | Status: DC | PRN
  Administered 2021-08-18: 1 mg via INTRAVENOUS

## 2021-08-18 MED ORDER — ASPIRIN 81 MG PO CHEW
324.0000 mg | CHEWABLE_TABLET | ORAL | Status: AC
  Administered 2021-08-18: 243 mg via ORAL

## 2021-08-18 MED ORDER — LIDOCAINE HCL 1 % IJ SOLN
INTRAMUSCULAR | Status: AC
  Filled 2021-08-18: qty 20

## 2021-08-18 MED ORDER — HEPARIN (PORCINE) IN NACL 1000-0.9 UT/500ML-% IV SOLN
INTRAVENOUS | Status: AC
  Filled 2021-08-18: qty 1000

## 2021-08-18 MED ORDER — IOHEXOL 300 MG/ML  SOLN
INTRAMUSCULAR | Status: DC | PRN
  Administered 2021-08-18: 59 mL

## 2021-08-18 MED ORDER — SODIUM CHLORIDE 0.9 % IV SOLN: INTRAVENOUS | Status: DC

## 2021-08-18 MED ORDER — FENTANYL CITRATE (PF) 100 MCG/2ML IJ SOLN
INTRAMUSCULAR | Status: DC | PRN
  Administered 2021-08-18: 25 ug via INTRAVENOUS

## 2021-08-18 MED ORDER — ACETAMINOPHEN 325 MG PO TABS: 650.0000 mg | ORAL_TABLET | ORAL | Status: DC | PRN

## 2021-08-18 MED ORDER — HEPARIN SODIUM (PORCINE) 1000 UNIT/ML IJ SOLN
INTRAMUSCULAR | Status: AC
  Filled 2021-08-18: qty 10

## 2021-08-18 MED ORDER — HEPARIN (PORCINE) IN NACL 1000-0.9 UT/500ML-% IV SOLN
INTRAVENOUS | Status: DC | PRN
  Administered 2021-08-18: 1000 mL

## 2021-08-18 MED ORDER — SODIUM CHLORIDE 0.9 % IV SOLN: 250.0000 mL | INTRAVENOUS | Status: DC | PRN

## 2021-08-18 MED ORDER — HEPARIN SODIUM (PORCINE) 1000 UNIT/ML IJ SOLN
INTRAMUSCULAR | Status: DC | PRN
  Administered 2021-08-18: 5000 [IU] via INTRAVENOUS

## 2021-08-18 MED ORDER — ONDANSETRON HCL 4 MG/2ML IJ SOLN: 4.0000 mg | Freq: Four times a day (QID) | INTRAMUSCULAR | Status: DC | PRN

## 2021-08-18 MED ORDER — MIDAZOLAM HCL 2 MG/2ML IJ SOLN
INTRAMUSCULAR | Status: AC
  Filled 2021-08-18: qty 2

## 2021-08-18 MED ORDER — NITROGLYCERIN 1 MG/10 ML FOR IR/CATH LAB
INTRA_ARTERIAL | Status: DC | PRN
  Administered 2021-08-18: 100 ug via INTRA_ARTERIAL

## 2021-08-18 MED ORDER — SODIUM CHLORIDE 0.9% FLUSH: 3.0000 mL | Freq: Two times a day (BID) | INTRAVENOUS | Status: DC

## 2021-08-18 MED ORDER — VERAPAMIL HCL 2.5 MG/ML IV SOLN
INTRAVENOUS | Status: AC
  Filled 2021-08-18: qty 2

## 2021-08-18 MED ORDER — SPIRONOLACTONE 50 MG PO TABS: 50.0000 mg | ORAL_TABLET | Freq: Every day | ORAL | 3 refills | Status: AC

## 2021-08-18 MED ORDER — FENTANYL CITRATE (PF) 100 MCG/2ML IJ SOLN
INTRAMUSCULAR | Status: AC
  Filled 2021-08-18: qty 2

## 2021-08-18 MED ORDER — ASPIRIN 81 MG PO CHEW
CHEWABLE_TABLET | ORAL | Status: AC
  Filled 2021-08-18: qty 3

## 2021-08-18 SURGICAL SUPPLY — 12 items
CATH 5F 110X4 TIG (CATHETERS) ×1 IMPLANT
CATH EXPO 5FR FR4 (CATHETERS) ×1 IMPLANT
CATH INFINITI 5 FR JL3.5 (CATHETERS) ×1 IMPLANT
DEVICE RAD TR BAND REGULAR (VASCULAR PRODUCTS) ×2 IMPLANT
DRAPE BRACHIAL (DRAPES) ×1 IMPLANT
GLIDESHEATH SLEND SS 6F .021 (SHEATH) ×1 IMPLANT
GUIDEWIRE INQWIRE 1.5J.035X260 (WIRE) IMPLANT
INQWIRE 1.5J .035X260CM (WIRE) ×2
PACK CARDIAC CATH (CUSTOM PROCEDURE TRAY) ×2 IMPLANT
PROTECTION STATION PRESSURIZED (MISCELLANEOUS) ×2
SET ATX SIMPLICITY (MISCELLANEOUS) ×1 IMPLANT
STATION PROTECTION PRESSURIZED (MISCELLANEOUS) IMPLANT

## 2021-08-18 NOTE — H&P (Signed)
[facility] Cardiology History and Physical  Patient ID: Ernest Pennington MRN: [id number] DOB/AGE: October 11, 1955 66 y.o. Admit date: 08/18/2021  Primary Care Physician: Hortencia Pilar, MD Primary Cardiologist Andrez Grime, MD  HPI:  Ernest Pennington is a 66 year old male with a history of nonobstructive CAD, ASD s/p occluder, type 2 diabetes, hypertension, hyperlipidemia, CVA 01/2021, OSA on CPAP, atrial fibrillation/flutter who was been experiencing shortness of breath and fatigue.  For part of his evaluation he underwent a nuclear medicine stress test that showed anterior ischemia.  He has not had significant chest pain, but is very limited by his fatigue and shortness of breath.    Past Medical History:  Diagnosis Date   ATRIAL SEPTAL DEFECT    Back pain    CAD    Diverticulitis    History of rectal bleeding    HYPERLIPIDEMIA    HYPERTENSION    Pre-diabetes     Past Surgical History:  Procedure Laterality Date   COLONOSCOPY WITH PROPOFOL N/A 12/05/2017   Procedure: COLONOSCOPY WITH PROPOFOL;  Surgeon: Lin Landsman, MD;  Location: [facility] ENDOSCOPY;  Service: Gastroenterology;  Laterality: N/A;   FOOT SURGERY     HERNIA REPAIR     PROSTATE SURGERY  2018   repair of heart defect      Medications Prior to Admission  Medication Sig Dispense Refill Last Dose   allopurinol (ZYLOPRIM) 300 MG tablet Take 300 mg by mouth daily.        apixaban (ELIQUIS) 5 MG TABS tablet Take 5 mg by mouth 2 (two) times daily.      aspirin 81 MG tablet Take 81 mg by mouth daily.        cloNIDine (CATAPRES) 0.1 MG tablet Take 0.1 mg by mouth 2 (two) times daily.      diltiazem (CARDIZEM CD) 300 MG 24 hr capsule Take 300 mg by mouth daily.      rosuvastatin (CRESTOR) 10 MG tablet Take 10 mg by mouth daily.      sitaGLIPtin (JANUVIA) 50 MG tablet Take 50 mg by mouth daily.      spironolactone (ALDACTONE) 50 MG tablet Take 50 mg by mouth daily.      traZODone (DESYREL) 50 MG tablet Take 50 mg by  mouth at bedtime as needed for sleep.      Social History   Socioeconomic History   Marital status: Married    Spouse name: Not on file   Number of children: Not on file   Years of education: Not on file   Highest education level: Not on file  Occupational History   Not on file  Tobacco Use   Smoking status: Former   Smokeless tobacco: Never  Vaping Use   Vaping Use: Never used  Substance and Sexual Activity   Alcohol use: Not Currently   Drug use: Never   Sexual activity: Yes    Partners: Female  Other Topics Concern   Not on file  Social History Narrative   Not on file   Social Determinants of Health   Financial Resource Strain: Not on file  Food Insecurity: Not on file  Transportation Needs: Not on file  Physical Activity: Not on file  Stress: Not on file  Social Connections: Not on file  Intimate Partner Violence: Not on file    History reviewed. No pertinent family history.    Review of systems complete and found to be negative unless listed above      Physical Exam:  General: Well  developed, well nourished, in no acute distress HEENT:  Normocephalic and atramatic Neck:  No JVD.  Lungs: Clear bilaterally to auscultation and percussion. Heart: HRRR . Normal S1 and S2 without gallops or murmurs.  Abdomen: Bowel sounds are positive, abdomen soft and non-tender  Msk:  Back normal, normal gait. Normal strength and tone for age. Extremities: No clubbing, cyanosis or edema.   Neuro: Alert and oriented X 3. Psych:  Good affect, responds appropriately   Labs:   Lab Results  Component Value Date   WBC 6.3 12/02/2017   HGB 13.8 12/02/2017   HCT 41.0 12/02/2017   MCV 80 12/02/2017   PLT 167 12/02/2017   No results for input(s): NA, K, CL, CO2, BUN, CREATININE, CALCIUM, PROT, BILITOT, ALKPHOS, ALT, AST, GLUCOSE in the last 168 hours.  Invalid input(s): LABALBU Lab Results  Component Value Date   TROPONINI < 0.02 10/16/2011    Lab Results  Component  Value Date   CHOL 116 10/17/2011   Lab Results  Component Value Date   HDL 34 (L) 10/17/2011   Lab Results  Component Value Date   LDLCALC 67 10/17/2011   Lab Results  Component Value Date   TRIG 75 10/17/2011   No results found for: CHOLHDL No results found for: LDLDIRECT    Radiology: No results found.  EKG: Sinus bradycardia with first-degree AV block.  Poor R wave progression.  Echo 08/2020 [facility]- Summary    1. Technically difficult study due to chest wall/lung interference.    2. The left ventricular systolic function is normal, LVEF is visually  estimated at > 55%.    3. The right ventricle is normal in size, with normal systolic function.    4. There is mild aortic valve stenosis.    5. No interatrial flow communication by agitated saline injection.   ASSESSMENT AND PLAN:  66 year old male with a history of type 2 diabetes, CVA, hypertension, hyperlipidemia, prior nonobstructive CAD who was been experiencing shortness of breath and fatigue and underwent stress testing which showed anterior ischemia.  Given his high pretest probability and worsening symptoms he is referred for heart catheterization for further evaluation.  The procedure including risks and benefits was discussed at length with the patient who is agreeable to proceed.  Signed: Andrez Grime MD 08/18/2021, 8:06 AM

## 2021-08-18 NOTE — Progress Notes (Signed)
MD Corky Sox to bedside to review EKG's and discuss plans with pt and his wife. Answering questions. Cleared for discharge by MD.

## 2021-09-11 DIAGNOSIS — I480 Paroxysmal atrial fibrillation: Secondary | ICD-10-CM | POA: Diagnosis not present

## 2021-09-11 DIAGNOSIS — I2510 Atherosclerotic heart disease of native coronary artery without angina pectoris: Secondary | ICD-10-CM | POA: Diagnosis not present

## 2021-09-18 DIAGNOSIS — Z4889 Encounter for other specified surgical aftercare: Secondary | ICD-10-CM | POA: Diagnosis not present

## 2021-09-18 DIAGNOSIS — J9811 Atelectasis: Secondary | ICD-10-CM | POA: Diagnosis not present

## 2021-09-18 DIAGNOSIS — Z951 Presence of aortocoronary bypass graft: Secondary | ICD-10-CM | POA: Diagnosis not present

## 2021-09-18 DIAGNOSIS — J90 Pleural effusion, not elsewhere classified: Secondary | ICD-10-CM | POA: Diagnosis not present

## 2021-09-27 ENCOUNTER — Encounter: Payer: 59 | Attending: Cardiology | Admitting: *Deleted

## 2021-09-27 ENCOUNTER — Other Ambulatory Visit: Payer: Self-pay

## 2021-09-27 DIAGNOSIS — Z951 Presence of aortocoronary bypass graft: Secondary | ICD-10-CM

## 2021-09-27 NOTE — Progress Notes (Signed)
Initial telephone orientation completed. Diagnosis can be found in [facility] 1/26. EP orientation scheduled for Thursday 3/9 at 10am.

## 2021-10-12 ENCOUNTER — Encounter: Payer: Medicare Other | Attending: Cardiology

## 2021-10-12 ENCOUNTER — Other Ambulatory Visit: Payer: Self-pay

## 2021-10-12 VITALS — Ht 71.5 in | Wt 235.7 lb

## 2021-10-12 DIAGNOSIS — Z951 Presence of aortocoronary bypass graft: Secondary | ICD-10-CM | POA: Insufficient documentation

## 2021-10-12 DIAGNOSIS — Z79899 Other long term (current) drug therapy: Secondary | ICD-10-CM | POA: Diagnosis not present

## 2021-10-12 NOTE — Progress Notes (Signed)
Cardiac Individual Treatment Plan  Patient Details  Name: Ernest Pennington MRN: [id number] Date of Birth: 08/12/1955 Referring Provider:   Flowsheet Row Cardiac Rehab from 10/12/2021 in [facility] Cardiac and Pulmonary Rehab  Referring Provider Donnelly Angelica MD       Initial Encounter Date:  Flowsheet Row Cardiac Rehab from 10/12/2021 in [facility] Cardiac and Pulmonary Rehab  Date 10/12/21       Visit Diagnosis: S/P CABG x 4  Patient's Home Medications on Admission:  Current Outpatient Medications:    allopurinol (ZYLOPRIM) 300 MG tablet, Take 300 mg by mouth daily.   (Patient not taking: Reported on 09/27/2021), Disp: , Rfl:    apixaban (ELIQUIS) 5 MG TABS tablet, Take 5 mg by mouth 2 (two) times daily., Disp: , Rfl:    aspirin 81 MG tablet, Take 81 mg by mouth daily.  , Disp: , Rfl:    bumetanide (BUMEX) 0.5 MG tablet, Take by mouth., Disp: , Rfl:    cloNIDine (CATAPRES) 0.1 MG tablet, Take 0.1 mg by mouth 2 (two) times daily. (Patient not taking: Reported on 09/27/2021), Disp: , Rfl:    diltiazem (CARDIZEM CD) 240 MG 24 hr capsule, Take 1 capsule (240 mg total) by mouth daily. (Patient not taking: Reported on 09/27/2021), Disp: 30 capsule, Rfl: 11   empagliflozin (JARDIANCE) 10 MG TABS tablet, Take 1 tablet by mouth daily., Disp: , Rfl:    metoprolol tartrate (LOPRESSOR) 25 MG tablet, Take by mouth., Disp: , Rfl:    rosuvastatin (CRESTOR) 10 MG tablet, Take 10 mg by mouth daily., Disp: , Rfl:    sitaGLIPtin (JANUVIA) 50 MG tablet, Take 1 tablet (50 mg total) by mouth daily., Disp: 90 tablet, Rfl: 3   spironolactone (ALDACTONE) 50 MG tablet, Take 1 tablet (50 mg total) by mouth daily. (Patient not taking: Reported on 09/27/2021), Disp: 90 tablet, Rfl: 3   traZODone (DESYREL) 50 MG tablet, Take 50 mg by mouth at bedtime as needed for sleep., Disp: , Rfl:   Past Medical History: Past Medical History:  Diagnosis Date   ATRIAL SEPTAL DEFECT    Back pain    CAD    Diverticulitis    History of  rectal bleeding    HYPERLIPIDEMIA    HYPERTENSION    Pre-diabetes     Tobacco Use: Social History   Tobacco Use  Smoking Status Former  Smokeless Tobacco Never    Labs: Recent Review Scientist, physiological     Labs for ITP Cardiac and Pulmonary Rehab Latest Ref Rng & Units 10/17/2011   Cholestrol 0 - 200 mg/dL 116   LDLCALC 0 - 100 mg/dL 67   HDL 40 - 60 mg/dL 34(L)   Trlycerides 0 - 200 mg/dL 75   Hemoglobin A1c 4.2 - 6.3 % 7.5(H)        Exercise Target Goals: Exercise Program Goal: Individual exercise prescription set using results from initial 6 min walk test and THRR while considering  patients activity barriers and safety.   Exercise Prescription Goal: Initial exercise prescription builds to 30-45 minutes a day of aerobic activity, 2-3 days per week.  Home exercise guidelines will be given to patient during program as part of exercise prescription that the participant will acknowledge.   Education: Aerobic Exercise: - Group verbal and visual presentation on the components of exercise prescription. Introduces F.I.T.T principle from ACSM for exercise prescriptions.  Reviews F.I.T.T. principles of aerobic exercise including progression. Written material given at graduation. Flowsheet Row Cardiac Rehab from 10/12/2021 in [facility]  Cardiac and Pulmonary Rehab  Education need identified 10/12/21       Education: Resistance Exercise: - Group verbal and visual presentation on the components of exercise prescription. Introduces F.I.T.T principle from ACSM for exercise prescriptions  Reviews F.I.T.T. principles of resistance exercise including progression. Written material given at graduation.    Education: Exercise & Equipment Safety: - Individual verbal instruction and demonstration of equipment use and safety with use of the equipment. Flowsheet Row Cardiac Rehab from 10/12/2021 in [facility] Cardiac and Pulmonary Rehab  Education need identified 10/12/21  Date 10/12/21  Educator [place]   Instruction Review Code 1- Verbalizes Understanding       Education: Exercise Physiology & General Exercise Guidelines: - Group verbal and written instruction with models to review the exercise physiology of the cardiovascular system and associated critical values. Provides general exercise guidelines with specific guidelines to those with heart or lung disease.    Education: Flexibility, Balance, Mind/Body Relaxation: - Group verbal and visual presentation with interactive activity on the components of exercise prescription. Introduces F.I.T.T principle from ACSM for exercise prescriptions. Reviews F.I.T.T. principles of flexibility and balance exercise training including progression. Also discusses the mind body connection.  Reviews various relaxation techniques to help reduce and manage stress (i.e. Deep breathing, progressive muscle relaxation, and visualization). Balance handout provided to take home. Written material given at graduation.   Activity Barriers & Risk Stratification:  Activity Barriers & Cardiac Risk Stratification - 10/12/21 1128       Activity Barriers & Cardiac Risk Stratification   Activity Barriers Incisional Pain;Back Problems    Cardiac Risk Stratification High             6 Minute Walk:  6 Minute Walk     Row Name 10/12/21 1407         6 Minute Walk   Phase Initial     Distance 1350 feet     Walk Time 6 minutes     # of Rest Breaks 0     MPH 2.55     METS 3.14     RPE 7     Perceived Dyspnea  1     VO2 Peak 11.01     Symptoms No     Resting HR 65 bpm     Resting BP 158/86     Resting Oxygen Saturation  98 %     Exercise Oxygen Saturation  during 6 min walk 98 %     Max Ex. HR 83 bpm     Max Ex. BP 172/88     2 Minute Post BP 152/84              Oxygen Initial Assessment:   Oxygen Re-Evaluation:   Oxygen Discharge (Final Oxygen Re-Evaluation):   Initial Exercise Prescription:  Initial Exercise Prescription - 10/12/21  1400       Date of Initial Exercise RX and Referring Provider   Date 10/12/21    Referring Provider Donnelly Angelica MD      Oxygen   Maintain Oxygen Saturation 88% or higher      Treadmill   MPH 2.7    Grade 1    Minutes 15    METs 3.44      NuStep   Level 3    SPM 80    Minutes 15    METs 3      REL-XR   Level 2    Speed 50    Minutes 15    METs  3      T5 Nustep   Level 2    SPM 80    Minutes 15    METs 3      Prescription Details   Frequency (times per week) 3    Duration Progress to 30 minutes of continuous aerobic without signs/symptoms of physical distress      Intensity   THRR 40-80% of Max Heartrate 101- 137    Ratings of Perceived Exertion 11-13    Perceived Dyspnea 0-4      Progression   Progression Continue to progress workloads to maintain intensity without signs/symptoms of physical distress.      Resistance Training   Training Prescription Yes    Weight 4 lb    Reps 10-15             Perform Capillary Blood Glucose checks as needed.  Exercise Prescription Changes:   Exercise Prescription Changes     Row Name 10/12/21 1400             Response to Exercise   Blood Pressure (Admit) 158/86       Blood Pressure (Exercise) 172/88       Blood Pressure (Exit) 152/84       Heart Rate (Admit) 65 bpm       Heart Rate (Exercise) 83 bpm       Heart Rate (Exit) 62 bpm       Oxygen Saturation (Admit) 98 %       Oxygen Saturation (Exercise) 98 %       Rating of Perceived Exertion (Exercise) 7       Perceived Dyspnea (Exercise) 1       Symptoms none       Comments walk test results                Exercise Comments:   Exercise Goals and Review:   Exercise Goals     Row Name 10/12/21 1412             Exercise Goals   Increase Physical Activity Yes       Intervention Provide advice, education, support and counseling about physical activity/exercise needs.;Develop an individualized exercise prescription for aerobic and  resistive training based on initial evaluation findings, risk stratification, comorbidities and participant's personal goals.       Expected Outcomes Short Term: Attend rehab on a regular basis to increase amount of physical activity.;Long Term: Add in home exercise to make exercise part of routine and to increase amount of physical activity.;Long Term: Exercising regularly at least 3-5 days a week.       Increase Strength and Stamina Yes       Intervention Provide advice, education, support and counseling about physical activity/exercise needs.;Develop an individualized exercise prescription for aerobic and resistive training based on initial evaluation findings, risk stratification, comorbidities and participant's personal goals.       Expected Outcomes Short Term: Increase workloads from initial exercise prescription for resistance, speed, and METs.;Short Term: Perform resistance training exercises routinely during rehab and add in resistance training at home;Long Term: Improve cardiorespiratory fitness, muscular endurance and strength as measured by increased METs and functional capacity (6MWT)       Able to understand and use rate of perceived exertion (RPE) scale Yes       Intervention Provide education and explanation on how to use RPE scale       Expected Outcomes Short Term: Able to use RPE daily in rehab to  express subjective intensity level;Long Term:  Able to use RPE to guide intensity level when exercising independently       Able to understand and use Dyspnea scale Yes       Intervention Provide education and explanation on how to use Dyspnea scale       Expected Outcomes Short Term: Able to use Dyspnea scale daily in rehab to express subjective sense of shortness of breath during exertion;Long Term: Able to use Dyspnea scale to guide intensity level when exercising independently       Knowledge and understanding of Target Heart Rate Range (THRR) Yes       Intervention Provide education and  explanation of THRR including how the numbers were predicted and where they are located for reference       Expected Outcomes Short Term: Able to state/look up THRR;Long Term: Able to use THRR to govern intensity when exercising independently;Short Term: Able to use daily as guideline for intensity in rehab       Able to check pulse independently Yes       Intervention Provide education and demonstration on how to check pulse in carotid and radial arteries.;Review the importance of being able to check your own pulse for safety during independent exercise       Expected Outcomes Long Term: Able to check pulse independently and accurately;Short Term: Able to explain why pulse checking is important during independent exercise       Understanding of Exercise Prescription Yes       Intervention Provide education, explanation, and written materials on patient's individual exercise prescription       Expected Outcomes Short Term: Able to explain program exercise prescription;Long Term: Able to explain home exercise prescription to exercise independently                Exercise Goals Re-Evaluation :   Discharge Exercise Prescription (Final Exercise Prescription Changes):  Exercise Prescription Changes - 10/12/21 1400       Response to Exercise   Blood Pressure (Admit) 158/86    Blood Pressure (Exercise) 172/88    Blood Pressure (Exit) 152/84    Heart Rate (Admit) 65 bpm    Heart Rate (Exercise) 83 bpm    Heart Rate (Exit) 62 bpm    Oxygen Saturation (Admit) 98 %    Oxygen Saturation (Exercise) 98 %    Rating of Perceived Exertion (Exercise) 7    Perceived Dyspnea (Exercise) 1    Symptoms none    Comments walk test results             Nutrition:  Target Goals: Understanding of nutrition guidelines, daily intake of sodium '1500mg'$ , cholesterol '200mg'$ , calories 30% from fat and 7% or less from saturated fats, daily to have 5 or more servings of fruits and vegetables.  Education: All  About Nutrition: -Group instruction provided by verbal, written material, interactive activities, discussions, models, and posters to present general guidelines for heart healthy nutrition including fat, fiber, MyPlate, the role of sodium in heart healthy nutrition, utilization of the nutrition label, and utilization of this knowledge for meal planning. Follow up email sent as well. Written material given at graduation.   Biometrics:  Pre Biometrics - 10/12/21 1128       Pre Biometrics   Height 5' 11.5" (1.816 m)    Weight 235 lb 11.2 oz (106.9 kg)    BMI (Calculated) 32.42    Single Leg Stand 6.55 seconds  Nutrition Therapy Plan and Nutrition Goals:  Nutrition Therapy & Goals - 10/12/21 1129       Intervention Plan   Intervention Prescribe, educate and counsel regarding individualized specific dietary modifications aiming towards targeted core components such as weight, hypertension, lipid management, diabetes, heart failure and other comorbidities.    Expected Outcomes Short Term Goal: Understand basic principles of dietary content, such as calories, fat, sodium, cholesterol and nutrients.;Short Term Goal: A plan has been developed with personal nutrition goals set during dietitian appointment.;Long Term Goal: Adherence to prescribed nutrition plan.             Nutrition Assessments:  MEDIFICTS Score Key: ?70 Need to make dietary changes  40-70 Heart Healthy Diet ? 40 Therapeutic Level Cholesterol Diet  Flowsheet Row Cardiac Rehab from 10/12/2021 in [facility] Cardiac and Pulmonary Rehab  Picture Your Plate Total Score on Admission 67      Picture Your Plate Scores: <84 Unhealthy dietary pattern with much room for improvement. 41-50 Dietary pattern unlikely to meet recommendations for good health and room for improvement. 51-60 More healthful dietary pattern, with some room for improvement.  >60 Healthy dietary pattern, although there may be some specific  behaviors that could be improved.    Nutrition Goals Re-Evaluation:   Nutrition Goals Discharge (Final Nutrition Goals Re-Evaluation):   Psychosocial: Target Goals: Acknowledge presence or absence of significant depression and/or stress, maximize coping skills, provide positive support system. Participant is able to verbalize types and ability to use techniques and skills needed for reducing stress and depression.   Education: Stress, Anxiety, and Depression - Group verbal and visual presentation to define topics covered.  Reviews how body is impacted by stress, anxiety, and depression.  Also discusses healthy ways to reduce stress and to treat/manage anxiety and depression.  Written material given at graduation.   Education: Sleep Hygiene -Provides group verbal and written instruction about how sleep can affect your health.  Define sleep hygiene, discuss sleep cycles and impact of sleep habits. Review good sleep hygiene tips.    Initial Review & Psychosocial Screening:  Initial Psych Review & Screening - 09/27/21 1415       Initial Review   Current issues with Current Stress Concerns    Source of Stress Concerns Occupation      [place]? Yes   wife, family     Barriers   Psychosocial barriers to participate in program There are no identifiable barriers or psychosocial needs.;The patient should benefit from training in stress management and relaxation.      Screening Interventions   Interventions Encouraged to exercise;Provide feedback about the scores to participant    Expected Outcomes Short Term goal: Utilizing psychosocial counselor, staff and physician to assist with identification of specific Stressors or current issues interfering with healing process. Setting desired goal for each stressor or current issue identified.;Long Term Goal: Stressors or current issues are controlled or eliminated.;Short Term goal: Identification and review with  participant of any Quality of Life or Depression concerns found by scoring the questionnaire.;Long Term goal: The participant improves quality of Life and PHQ9 Scores as seen by post scores and/or verbalization of changes             Quality of Life Scores:   Quality of Life - 10/12/21 1128       Quality of Life   Select Quality of Life      Quality of Life Scores   Health/Function Pre 23.54 %  Socioeconomic Pre 23.25 %    Psych/Spiritual Pre 26 %    Family Pre 27.6 %    GLOBAL Pre 24.57 %            Scores of 19 and below usually indicate a poorer quality of life in these areas.  A difference of  2-3 points is a clinically meaningful difference.  A difference of 2-3 points in the total score of the Quality of Life Index has been associated with significant improvement in overall quality of life, self-image, physical symptoms, and general health in studies assessing change in quality of life.  PHQ-9: Recent Review Flowsheet Data     Depression screen [facility] 2/9 10/12/2021   Decreased Interest 0   Down, Depressed, Hopeless 0   PHQ - 2 Score 0   Altered sleeping 3   Tired, decreased energy 1   Change in appetite 2   Feeling bad or failure about yourself  2   Trouble concentrating 3   Moving slowly or fidgety/restless 3   Suicidal thoughts 0   PHQ-9 Score 14   Difficult doing work/chores Somewhat difficult      Interpretation of Total Score  Total Score Depression Severity:  1-4 = Minimal depression, 5-9 = Mild depression, 10-14 = Moderate depression, 15-19 = Moderately severe depression, 20-27 = Severe depression   Psychosocial Evaluation and Intervention:  Psychosocial Evaluation - 09/27/21 1416       Psychosocial Evaluation & Interventions   Interventions Relaxation education;Encouraged to exercise with the program and follow exercise prescription    Comments Mr. Lubin reports 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 He is already back to work part time at  his company. 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 He states he has a great support system of his wife and family. He is very active and wants to get back to his usual routine while learning more about how to live a heart healthy lifestyle. He knows his genetics play a big role in his medical history and wants to work on the things he can change.    Expected Outcomes Short: attend cardiac rehab for education and exercise. Long: develop and maintain positive self care habits.    Continue Psychosocial Services  Follow up required by staff             Psychosocial Re-Evaluation:   Psychosocial Discharge (Final Psychosocial Re-Evaluation):   Vocational Rehabilitation: Provide vocational rehab assistance to qualifying candidates.   Vocational Rehab Evaluation & Intervention:  Vocational Rehab - 09/27/21 1415       Initial Vocational Rehab Evaluation & Intervention   Assessment shows need for Vocational Rehabilitation No             Education: Education Goals: Education classes will be provided on a variety of topics geared toward better understanding of heart health and risk factor modification. Participant will state understanding/return demonstration of topics presented as noted by education test scores.  Learning Barriers/Preferences:  Learning Barriers/Preferences - 09/27/21 1413       Learning Barriers/Preferences   Learning Barriers None    Learning Preferences None             General Cardiac Education Topics:  AED/CPR: - Group verbal and written instruction with the use of models to demonstrate the basic use of the AED with the basic ABC's of resuscitation.   Anatomy and Cardiac Procedures: - Group verbal and  visual presentation and models provide information about basic cardiac anatomy and function. Reviews the testing methods done to diagnose heart disease and the outcomes of the test results. Describes  the treatment choices: Medical Management, Angioplasty, or Coronary Bypass Surgery for treating various heart conditions including Myocardial Infarction, Angina, Valve Disease, and Cardiac Arrhythmias.  Written material given at graduation.   Medication Safety: - Group verbal and visual instruction to review commonly prescribed medications for heart and lung disease. Reviews the medication, class of the drug, and side effects. Includes the steps to properly store meds and maintain the prescription regimen.  Written material given at graduation.   Intimacy: - Group verbal instruction through game format to discuss how heart and lung disease can affect sexual intimacy. Written material given at graduation..   Know Your Numbers and Heart Failure: - Group verbal and visual instruction to discuss disease risk factors for cardiac and pulmonary disease and treatment options.  Reviews associated critical values for Overweight/Obesity, Hypertension, Cholesterol, and Diabetes.  Discusses basics of heart failure: signs/symptoms and treatments.  Introduces Heart Failure Zone chart for action plan for heart failure.  Written material given at graduation. Flowsheet Row Cardiac Rehab from 10/12/2021 in [facility] Cardiac and Pulmonary Rehab  Education need identified 10/12/21       Infection Prevention: - Provides verbal and written material to individual with discussion of infection control including proper hand washing and proper equipment cleaning during exercise session. Flowsheet Row Cardiac Rehab from 10/12/2021 in [facility] Cardiac and Pulmonary Rehab  Education need identified 10/12/21  Date 10/12/21  Educator [place]  Instruction Review Code 1- Verbalizes Understanding       Falls Prevention: - Provides verbal and written material to individual with discussion of falls prevention and safety. Flowsheet Row Cardiac Rehab from 10/12/2021 in [facility] Cardiac and Pulmonary Rehab  Education need identified 10/12/21   Date 10/12/21  Educator [place]  Instruction Review Code 1- Verbalizes Understanding       Other: -Provides group and verbal instruction on various topics (see comments)   Knowledge Questionnaire Score:  Knowledge Questionnaire Score - 10/12/21 1122       Knowledge Questionnaire Score   Pre Score 24/26: PAD, Exercise             Core Components/Risk Factors/Patient Goals at Admission:  Personal Goals and Risk Factors at Admission - 10/12/21 1413       Core Components/Risk Factors/Patient Goals on Admission    Weight Management Yes;Weight Loss;Obesity    Intervention Weight Management: Develop a combined nutrition and exercise program designed to reach desired caloric intake, while maintaining appropriate intake of nutrient and fiber, sodium and fats, and appropriate energy expenditure required for the weight goal.;Weight Management: Provide education and appropriate resources to help participant work on and attain dietary goals.;Weight Management/Obesity: Establish reasonable short term and long term weight goals.    Admit Weight 235 lb (106.6 kg)    Goal Weight: Short Term 230 lb (104.3 kg)    Goal Weight: Long Term 210 lb (95.3 kg)    Expected Outcomes Short Term: Continue to assess and modify interventions until short term weight is achieved;Long Term: Adherence to nutrition and physical activity/exercise program aimed toward attainment of established weight goal;Weight Loss: Understanding of general recommendations for a balanced deficit meal plan, which promotes 1-2 lb weight loss per week and includes a negative energy balance of [phone] kcal/d;Understanding recommendations for meals to include 15-35% energy as protein, 25-35% energy from fat, 35-60% energy from carbohydrates, less  than '200mg'$  of dietary cholesterol, 20-35 gm of total fiber daily;Understanding of distribution of calorie intake throughout the day with the consumption of 4-5 meals/snacks    Diabetes Yes     Intervention Provide education about signs/symptoms and action to take for hypo/hyperglycemia.;Provide education about proper nutrition, including hydration, and aerobic/resistive exercise prescription along with prescribed medications to achieve blood glucose in normal ranges: Fasting glucose 65-99 mg/dL    Expected Outcomes Short Term: Participant verbalizes understanding of the signs/symptoms and immediate care of hyper/hypoglycemia, proper foot care and importance of medication, aerobic/resistive exercise and nutrition plan for blood glucose control.;Long Term: Attainment of HbA1C < 7%.    Hypertension Yes    Intervention Provide education on lifestyle modifcations including regular physical activity/exercise, weight management, moderate sodium restriction and increased consumption of fresh fruit, vegetables, and low fat dairy, alcohol moderation, and smoking cessation.;Monitor prescription use compliance.    Expected Outcomes Short Term: Continued assessment and intervention until BP is < 140/31m HG in hypertensive participants. < 130/826mHG in hypertensive participants with diabetes, heart failure or chronic kidney disease.;Long Term: Maintenance of blood pressure at goal levels.    Lipids Yes    Intervention Provide education and support for participant on nutrition & aerobic/resistive exercise along with prescribed medications to achieve LDL '70mg'$ , HDL >'40mg'$ .    Expected Outcomes Short Term: Participant states understanding of desired cholesterol values and is compliant with medications prescribed. Participant is following exercise prescription and nutrition guidelines.;Long Term: Cholesterol controlled with medications as prescribed, with individualized exercise RX and with personalized nutrition plan. Value goals: LDL < '70mg'$ , HDL > 40 mg.             Education:Diabetes - Individual verbal and written instruction to review signs/symptoms of diabetes, desired ranges of glucose level  fasting, after meals and with exercise. Acknowledge that pre and post exercise glucose checks will be done for 3 sessions at entry of program. [facility]  Date 09/27/21  [facility] Review Code 1- Verbalizes Understanding       Core Components/Risk Factors/Patient Goals Review:    Core Components/Risk Factors/Patient Goals at Discharge (Final Review):    ITP Comments:  ITP Comments     [place] 09/27/21 1418 10/12/21 1122         ITP Comments Initial telephone orientation completed. [facility]. EP orientation scheduled for Thursday 3/9 at 10am. Completed 6MWT and gym orientation. Initial ITP created and sent for review to Dr. MaEmily FilbertMedical Director.               Comments: Initial ITP

## 2021-10-12 NOTE — Patient Instructions (Signed)
Patient Instructions  Patient Details  Name: Ernest Pennington MRN: [id number] Date of Birth: [birth year] Referring Provider:  Andrez Grime, MD  Below are your personal goals for exercise, nutrition, and risk factors. Our goal is to help you stay on track towards obtaining and maintaining these goals. We will be discussing your progress on these goals with you throughout the program.  Initial Exercise Prescription:  Initial Exercise Prescription - 10/12/21 1400       Date of Initial Exercise RX and Referring Provider   Date 10/12/21    Referring Provider Donnelly Angelica MD      Oxygen   Maintain Oxygen Saturation 88% or higher      Treadmill   MPH 2.7    Grade 1    Minutes 15    METs 3.44      NuStep   Level 3    SPM 80    Minutes 15    METs 3      REL-XR   Level 2    Speed 50    Minutes 15    METs 3      T5 Nustep   Level 2    SPM 80    Minutes 15    METs 3      Prescription Details   Frequency (times per week) 3    Duration Progress to 30 minutes of continuous aerobic without signs/symptoms of physical distress      Intensity   THRR 40-80% of Max Heartrate 101- 137    Ratings of Perceived Exertion 11-13    Perceived Dyspnea 0-4      Progression   Progression Continue to progress workloads to maintain intensity without signs/symptoms of physical distress.      Resistance Training   Training Prescription Yes    Weight 4 lb    Reps 10-15             Exercise Goals: Frequency: Be able to perform aerobic exercise two to three times per week in program working toward 2-5 days per week of home exercise.  Intensity: Work with a perceived exertion of 11 (fairly light) - 15 (hard) while following your exercise prescription.  We will make changes to your prescription with you as you progress through the program.   Duration: Be able to do 30 to 45 minutes of continuous aerobic exercise in addition to a 5 minute warm-up and a 5 minute cool-down  routine.   Nutrition Goals: Your personal nutrition goals will be established when you do your nutrition analysis with the dietician.  The following are general nutrition guidelines to follow: Cholesterol < '200mg'$ /day Sodium < '1500mg'$ /day Fiber: Men over 50 yrs - 30 grams per day  Personal Goals:  Personal Goals and Risk Factors at Admission - 10/12/21 1413       Core Components/Risk Factors/Patient Goals on Admission    Weight Management Yes;Weight Loss;Obesity    Intervention Weight Management: Develop a combined nutrition and exercise program designed to reach desired caloric intake, while maintaining appropriate intake of nutrient and fiber, sodium and fats, and appropriate energy expenditure required for the weight goal.;Weight Management: Provide education and appropriate resources to help participant work on and attain dietary goals.;Weight Management/Obesity: Establish reasonable short term and long term weight goals.    Admit Weight 235 lb (106.6 kg)    Goal Weight: Short Term 230 lb (104.3 kg)    Goal Weight: Long Term 210 lb (95.3 kg)    Expected Outcomes Short  Term: Continue to assess and modify interventions until short term weight is achieved;Long Term: Adherence to nutrition and physical activity/exercise program aimed toward attainment of established weight goal;Weight Loss: Understanding of general recommendations for a balanced deficit meal plan, which promotes 1-2 lb weight loss per week and includes a negative energy balance of [phone] kcal/d;Understanding recommendations for meals to include 15-35% energy as protein, 25-35% energy from fat, 35-60% energy from carbohydrates, less than '200mg'$  of dietary cholesterol, 20-35 gm of total fiber daily;Understanding of distribution of calorie intake throughout the day with the consumption of 4-5 meals/snacks    Diabetes Yes    Intervention Provide education about signs/symptoms and action to take for hypo/hyperglycemia.;Provide  education about proper nutrition, including hydration, and aerobic/resistive exercise prescription along with prescribed medications to achieve blood glucose in normal ranges: Fasting glucose 65-99 mg/dL    Expected Outcomes Short Term: Participant verbalizes understanding of the signs/symptoms and immediate care of hyper/hypoglycemia, proper foot care and importance of medication, aerobic/resistive exercise and nutrition plan for blood glucose control.;Long Term: Attainment of HbA1C < 7%.    Hypertension Yes    Intervention Provide education on lifestyle modifcations including regular physical activity/exercise, weight management, moderate sodium restriction and increased consumption of fresh fruit, vegetables, and low fat dairy, alcohol moderation, and smoking cessation.;Monitor prescription use compliance.    Expected Outcomes Short Term: Continued assessment and intervention until BP is < 140/5m HG in hypertensive participants. < 130/871mHG in hypertensive participants with diabetes, heart failure or chronic kidney disease.;Long Term: Maintenance of blood pressure at goal levels.    Lipids Yes    Intervention Provide education and support for participant on nutrition & aerobic/resistive exercise along with prescribed medications to achieve LDL '70mg'$ , HDL >'40mg'$ .    Expected Outcomes Short Term: Participant states understanding of desired cholesterol values and is compliant with medications prescribed. Participant is following exercise prescription and nutrition guidelines.;Long Term: Cholesterol controlled with medications as prescribed, with individualized exercise RX and with personalized nutrition plan. Value goals: LDL < '70mg'$ , HDL > 40 mg.             Tobacco Use Initial Evaluation: Social History   Tobacco Use  Smoking Status Former  Smokeless Tobacco Never    Exercise Goals and Review:  Exercise Goals     Row Name 10/12/21 1412             Exercise Goals   Increase  Physical Activity Yes       Intervention Provide advice, education, support and counseling about physical activity/exercise needs.;Develop an individualized exercise prescription for aerobic and resistive training based on initial evaluation findings, risk stratification, comorbidities and participant's personal goals.       Expected Outcomes Short Term: Attend rehab on a regular basis to increase amount of physical activity.;Long Term: Add in home exercise to make exercise part of routine and to increase amount of physical activity.;Long Term: Exercising regularly at least 3-5 days a week.       Increase Strength and Stamina Yes       Intervention Provide advice, education, support and counseling about physical activity/exercise needs.;Develop an individualized exercise prescription for aerobic and resistive training based on initial evaluation findings, risk stratification, comorbidities and participant's personal goals.       Expected Outcomes Short Term: Increase workloads from initial exercise prescription for resistance, speed, and METs.;Short Term: Perform resistance training exercises routinely during rehab and add in resistance training at home;Long Term: Improve cardiorespiratory fitness, muscular endurance and strength  as measured by increased METs and functional capacity (6MWT)       Able to understand and use rate of perceived exertion (RPE) scale Yes       Intervention Provide education and explanation on how to use RPE scale       Expected Outcomes Short Term: Able to use RPE daily in rehab to express subjective intensity level;Long Term:  Able to use RPE to guide intensity level when exercising independently       Able to understand and use Dyspnea scale Yes       Intervention Provide education and explanation on how to use Dyspnea scale       Expected Outcomes Short Term: Able to use Dyspnea scale daily in rehab to express subjective sense of shortness of breath during exertion;Long Term:  Able to use Dyspnea scale to guide intensity level when exercising independently       Knowledge and understanding of Target Heart Rate Range (THRR) Yes       Intervention Provide education and explanation of THRR including how the numbers were predicted and where they are located for reference       Expected Outcomes Short Term: Able to state/look up THRR;Long Term: Able to use THRR to govern intensity when exercising independently;Short Term: Able to use daily as guideline for intensity in rehab       Able to check pulse independently Yes       Intervention Provide education and demonstration on how to check pulse in carotid and radial arteries.;Review the importance of being able to check your own pulse for safety during independent exercise       Expected Outcomes Long Term: Able to check pulse independently and accurately;Short Term: Able to explain why pulse checking is important during independent exercise       Understanding of Exercise Prescription Yes       Intervention Provide education, explanation, and written materials on patient's individual exercise prescription       Expected Outcomes Short Term: Able to explain program exercise prescription;Long Term: Able to explain home exercise prescription to exercise independently                Copy of goals given to participant.

## 2021-10-16 ENCOUNTER — Encounter: Payer: Medicare Other | Admitting: *Deleted

## 2021-10-16 ENCOUNTER — Other Ambulatory Visit: Payer: Self-pay

## 2021-10-16 DIAGNOSIS — Z951 Presence of aortocoronary bypass graft: Secondary | ICD-10-CM

## 2021-10-16 LAB — GLUCOSE, CAPILLARY
Glucose-Capillary: 150 mg/dL — ABNORMAL HIGH (ref 70–99)
Glucose-Capillary: 141 mg/dL — ABNORMAL HIGH (ref 70–99)

## 2021-10-16 NOTE — Progress Notes (Signed)
Daily Session Note ? ?Patient Details  ?Name: Ernest Pennington ?MRN: [id number] ?Date of Birth: July 14, 1956 ?Referring Provider:   ?Flowsheet Row Cardiac Rehab from 10/12/2021 in [facility] Cardiac and Pulmonary Rehab  ?Referring Provider Donnelly Angelica MD  ? ?  ? ? ?Encounter Date: 10/16/2021 ? ?Check In: ? Session Check In - 10/16/21 0927   ? ?  ? Check-In  ? Supervising physician immediately available to respond to emergencies See telemetry face sheet for immediately available ER MD   ? Location [facility]-Cardiac & Pulmonary Rehab   ? Staff Present Heath Lark, RN, BSN, CCRP;Joseph [place], RCP,RRT,BSRT;Kelly [place], [place], ACSM CEP, Exercise Physiologist   ? Virtual Visit No   ? Medication changes reported     No   ? Fall or balance concerns reported    No   ? Warm-up and Cool-down Performed on first and last piece of equipment   ? Resistance Training Performed Yes   ? VAD Patient? No   ? PAD/SET Patient? No   ?  ? Pain Assessment  ? Currently in Pain? No/denies   ? ?  ?  ? ?  ? ? ? ? ? ?Social History  ? ?Tobacco Use  ?Smoking Status Former  ?Smokeless Tobacco Never  ? ? ?Goals Met:  ?Exercise tolerated well ?Personal goals reviewed ?No report of concerns or symptoms today ? ?Goals Unmet:  ?Not Applicable ? ?Comments: First full day of exercise!  Patient was oriented to gym and equipment including functions, settings, policies, and procedures.  Patient's individual exercise prescription and treatment plan were reviewed.  All starting workloads were established based on the results of the 6 minute walk test done at initial orientation visit.  The plan for exercise progression was also introduced and progression will be customized based on patient's performance and goals.  ? ? ?Dr. Emily Filbert is Medical Director for [place].  ?Dr. Ottie Glazier is Medical Director for [facility] Pulmonary Rehabilitation. ?

## 2021-10-18 ENCOUNTER — Other Ambulatory Visit: Payer: Self-pay

## 2021-10-18 DIAGNOSIS — Z951 Presence of aortocoronary bypass graft: Secondary | ICD-10-CM

## 2021-10-18 LAB — GLUCOSE, CAPILLARY
Glucose-Capillary: 151 mg/dL — ABNORMAL HIGH (ref 70–99)
Glucose-Capillary: 115 mg/dL — ABNORMAL HIGH (ref 70–99)

## 2021-10-18 NOTE — Progress Notes (Signed)
Daily Session Note ? ?Patient Details  ?Name: Ernest Pennington ?MRN: [id number] ?Date of Birth: November 08, 1955 ?Referring Provider:   ?Flowsheet Row Cardiac Rehab from 10/12/2021 in [facility] Cardiac and Pulmonary Rehab  ?Referring Provider Donnelly Angelica MD  ? ?  ? ? ?Encounter Date: 10/18/2021 ? ?Check In: ? Session Check In - 10/18/21 0746   ? ?  ? Check-In  ? Supervising physician immediately available to respond to emergencies See telemetry face sheet for immediately available ER MD   ? Location [facility]-Cardiac & Pulmonary Rehab   ? Staff Present Birdie Sons, MPA, RN;Joseph [place], RCP,RRT,BSRT;Jessica Kevin, MA, RCEP, CCRP, CCET   ? Virtual Visit No   ? Medication changes reported     No   ? Fall or balance concerns reported    No   ? Warm-up and Cool-down Performed on first and last piece of equipment   ? Resistance Training Performed Yes   ? VAD Patient? No   ? PAD/SET Patient? No   ?  ? Pain Assessment  ? Currently in Pain? No/denies   ? ?  ?  ? ?  ? ? ? ? ? ?Social History  ? ?Tobacco Use  ?Smoking Status Former  ?Smokeless Tobacco Never  ? ? ?Goals Met:  ?Independence with exercise equipment ?Exercise tolerated well ?No report of concerns or symptoms today ?Strength training completed today ? ?Goals Unmet:  ?Not Applicable ? ?Comments: Pt able to follow exercise prescription today without complaint.  Will continue to monitor for progression. ? ? ? ?Dr. Emily Filbert is Medical Director for [place].  ?Dr. Ottie Glazier is Medical Director for [facility] Pulmonary Rehabilitation. ?

## 2021-10-20 ENCOUNTER — Other Ambulatory Visit: Payer: Self-pay

## 2021-10-20 ENCOUNTER — Encounter: Payer: Medicare Other | Admitting: *Deleted

## 2021-10-20 DIAGNOSIS — Z951 Presence of aortocoronary bypass graft: Secondary | ICD-10-CM

## 2021-10-20 LAB — GLUCOSE, CAPILLARY
Glucose-Capillary: 169 mg/dL — ABNORMAL HIGH (ref 70–99)
Glucose-Capillary: 105 mg/dL — ABNORMAL HIGH (ref 70–99)

## 2021-10-20 NOTE — Progress Notes (Signed)
Daily Session Note ? ?Patient Details  ?Name: Ernest Pennington ?MRN: [id number] ?Date of Birth: [birth year] ?Referring Provider:   ?Flowsheet Row Cardiac Rehab from 10/12/2021 in [facility] Cardiac and Pulmonary Rehab  ?Referring Provider Donnelly Angelica MD  ? ?  ? ? ?Encounter Date: 10/20/2021 ? ?Check In: ? Session Check In - 10/20/21 0827   ? ?  ? Check-In  ? Supervising physician immediately available to respond to emergencies See telemetry face sheet for immediately available ER MD   ? Location [facility]-Cardiac & Pulmonary Rehab   ? Staff Present Heath Lark, RN, BSN, CCRP;Joseph [place], [place], [place], [place], [place], CCET   ? Virtual Visit No   ? Medication changes reported     No   ? Fall or balance concerns reported    No   ? Warm-up and Cool-down Performed on first and last piece of equipment   ? Resistance Training Performed Yes   ? VAD Patient? No   ? PAD/SET Patient? No   ?  ? Pain Assessment  ? Currently in Pain? No/denies   ? ?  ?  ? ?  ? ? ? ? ? ?Social History  ? ?Tobacco Use  ?Smoking Status Former  ?Smokeless Tobacco Never  ? ? ?Goals Met:  ?Independence with exercise equipment ?Exercise tolerated well ?No report of concerns or symptoms today ? ?Goals Unmet:  ?Not Applicable ? ?Comments: Pt able to follow exercise prescription today without complaint.  Will continue to monitor for progression. ? ? ? ?Dr. Emily Filbert is Medical Director for [place].  ?Dr. Ottie Glazier is Medical Director for [facility] Pulmonary Rehabilitation. ?

## 2021-10-23 ENCOUNTER — Other Ambulatory Visit: Payer: Self-pay

## 2021-10-23 ENCOUNTER — Encounter: Payer: Medicare Other | Admitting: *Deleted

## 2021-10-23 DIAGNOSIS — Z951 Presence of aortocoronary bypass graft: Secondary | ICD-10-CM

## 2021-10-23 NOTE — Progress Notes (Signed)
Daily Session Note ? ?Patient Details  ?Name: Ernest Pennington ?MRN: [id number] ?Date of Birth: Jun 01, 1956 ?Referring Provider:   ?Flowsheet Row Cardiac Rehab from 10/12/2021 in [facility] Cardiac and Pulmonary Rehab  ?Referring Provider Donnelly Angelica MD  ? ?  ? ? ?Encounter Date: 10/23/2021 ? ?Check In: ? Session Check In - 10/23/21 0845   ? ?  ? Check-In  ? Supervising physician immediately available to respond to emergencies See telemetry face sheet for immediately available ER MD   ? Location [facility]-Cardiac & Pulmonary Rehab   ? Staff Present Heath Lark, RN, BSN, [place], BS, ACSM CEP, Exercise Physiologist;Joseph [place], [place]   ? Virtual Visit No   ? Medication changes reported     No   ? Fall or balance concerns reported    No   ? Warm-up and Cool-down Performed on first and last piece of equipment   ? Resistance Training Performed Yes   ? VAD Patient? No   ? PAD/SET Patient? No   ?  ? Pain Assessment  ? Currently in Pain? No/denies   ? ?  ?  ? ?  ? ? ? ? ? ?Social History  ? ?Tobacco Use  ?Smoking Status Former  ?Smokeless Tobacco Never  ? ? ?Goals Met:  ?Independence with exercise equipment ?Exercise tolerated well ?No report of concerns or symptoms today ? ?Goals Unmet:  ?Not Applicable ? ?Comments: Pt able to follow exercise prescription today without complaint.  Will continue to monitor for progression. ? ? ? ?Dr. Emily Filbert is Medical Director for [place].  ?Dr. Ottie Glazier is Medical Director for [facility] Pulmonary Rehabilitation. ?

## 2021-10-25 ENCOUNTER — Encounter: Payer: Self-pay | Admitting: *Deleted

## 2021-10-25 ENCOUNTER — Other Ambulatory Visit: Payer: Self-pay

## 2021-10-25 DIAGNOSIS — Z951 Presence of aortocoronary bypass graft: Secondary | ICD-10-CM

## 2021-10-25 NOTE — Progress Notes (Signed)
Daily Session Note ? ?Patient Details  ?Name: Nyquan Selbe ?MRN: [id number] ?Date of Birth: 28-Apr-1956 ?Referring Provider:   ?Flowsheet Row Cardiac Rehab from 10/12/2021 in [facility] Cardiac and Pulmonary Rehab  ?Referring Provider Donnelly Angelica MD  ? ?  ? ? ?Encounter Date: 10/25/2021 ? ?Check In: ? Session Check In - 10/25/21 0746   ? ?  ? Check-In  ? Supervising physician immediately available to respond to emergencies See telemetry face sheet for immediately available ER MD   ? Location [facility]-Cardiac & Pulmonary Rehab   ? Staff Present Birdie Sons, MPA, RN;Melissa [place], RDN, LDN;Joseph [place], RCP,RRT,BSRT   ? Virtual Visit No   ? Medication changes reported     No   ? Fall or balance concerns reported    No   ? Warm-up and Cool-down Performed on first and last piece of equipment   ? Resistance Training Performed Yes   ? VAD Patient? No   ? PAD/SET Patient? No   ?  ? Pain Assessment  ? Currently in Pain? No/denies   ? ?  ?  ? ?  ? ? ? ? ? ?Social History  ? ?Tobacco Use  ?Smoking Status Former  ?Smokeless Tobacco Never  ? ? ?Goals Met:  ?Independence with exercise equipment ?Exercise tolerated well ?No report of concerns or symptoms today ?Strength training completed today ? ?Goals Unmet:  ?Not Applicable ? ?Comments: Pt able to follow exercise prescription today without complaint.  Will continue to monitor for progression. ? ? ? ?Dr. Emily Filbert is Medical Director for [place].  ?Dr. Ottie Glazier is Medical Director for [facility] Pulmonary Rehabilitation. ?

## 2021-10-25 NOTE — Progress Notes (Signed)
Cardiac Individual Treatment Plan ? ?Patient Details  ?Name: Ernest Pennington ?MRN: [id number] ?Date of Birth: 08/08/1955 ?Referring Provider:   ?Flowsheet Row Cardiac Rehab from 10/12/2021 in [facility] Cardiac and Pulmonary Rehab  ?Referring Provider Donnelly Angelica MD  ? ?  ? ? ?Initial Encounter Date:  ?Flowsheet Row Cardiac Rehab from 10/12/2021 in [facility] Cardiac and Pulmonary Rehab  ?Date 10/12/21  ? ?  ? ? ?Visit Diagnosis: S/P CABG x 4 ? ?Patient's Home Medications on Admission: ? ?Current Outpatient Medications:  ?  allopurinol (ZYLOPRIM) 300 MG tablet, Take 300 mg by mouth daily.   (Patient not taking: Reported on 09/27/2021), Disp: , Rfl:  ?  apixaban (ELIQUIS) 5 MG TABS tablet, Take 5 mg by mouth 2 (two) times daily., Disp: , Rfl:  ?  aspirin 81 MG tablet, Take 81 mg by mouth daily.  , Disp: , Rfl:  ?  bumetanide (BUMEX) 0.5 MG tablet, Take by mouth., Disp: , Rfl:  ?  cloNIDine (CATAPRES) 0.1 MG tablet, Take 0.1 mg by mouth 2 (two) times daily. (Patient not taking: Reported on 09/27/2021), Disp: , Rfl:  ?  diltiazem (CARDIZEM CD) 240 MG 24 hr capsule, Take 1 capsule (240 mg total) by mouth daily. (Patient not taking: Reported on 09/27/2021), Disp: 30 capsule, Rfl: 11 ?  empagliflozin (JARDIANCE) 10 MG TABS tablet, Take 1 tablet by mouth daily., Disp: , Rfl:  ?  metoprolol tartrate (LOPRESSOR) 25 MG tablet, Take by mouth., Disp: , Rfl:  ?  rosuvastatin (CRESTOR) 10 MG tablet, Take 10 mg by mouth daily., Disp: , Rfl:  ?  sitaGLIPtin (JANUVIA) 50 MG tablet, Take 1 tablet (50 mg total) by mouth daily., Disp: 90 tablet, Rfl: 3 ?  spironolactone (ALDACTONE) 50 MG tablet, Take 1 tablet (50 mg total) by mouth daily. (Patient not taking: Reported on 09/27/2021), Disp: 90 tablet, Rfl: 3 ?  traZODone (DESYREL) 50 MG tablet, Take 50 mg by mouth at bedtime as needed for sleep., Disp: , Rfl:  ? ?Past Medical History: ?Past Medical History:  ?Diagnosis Date  ? ATRIAL SEPTAL DEFECT   ? Back pain   ? CAD   ? Diverticulitis   ? History of  rectal bleeding   ? HYPERLIPIDEMIA   ? HYPERTENSION   ? Pre-diabetes   ? ? ?Tobacco Use: ?Social History  ? ?Tobacco Use  ?Smoking Status Former  ?Smokeless Tobacco Never  ? ? ?Labs: ?Review Flowsheet   ? ?  ?  Latest Ref Rng & Units 10/17/2011  ?Labs for [facility] Cardiac and Pulmonary Rehab  ?Cholestrol 0 - 200 mg/dL 116    ?LDL (calc) 0 - 100 mg/dL 67    ?HDL-C 40 - 60 mg/dL 34    ?Trlycerides 0 - 200 mg/dL 75    ?Hemoglobin A1c 4.2 - 6.3 % 7.5    ?  ? ? Multiple values from one day are sorted in reverse-chronological order  ?  ?  ? ? ? ?Exercise Target Goals: ?Exercise Program Goal: ?Individual exercise prescription set using results from initial 6 min walk test and THRR while considering  patient?s activity barriers and safety.  ? ?Exercise Prescription Goal: ?Initial exercise prescription builds to 30-45 minutes a day of aerobic activity, 2-3 days per week.  Home exercise guidelines will be given to patient during program as part of exercise prescription that the participant will acknowledge. ? ? ?Education: Aerobic Exercise: ?- Group verbal and visual presentation on the components of exercise prescription. Introduces F.I.T.T principle from ACSM for  exercise prescriptions.  Reviews F.I.T.T. principles of aerobic exercise including progression. Written material given at graduation. ?Flowsheet Row Cardiac Rehab from 10/12/2021 in [facility] Cardiac and Pulmonary Rehab  ?Education need identified 10/12/21  ? ?  ? ? ?Education: Resistance Exercise: ?- Group verbal and visual presentation on the components of exercise prescription. Introduces F.I.T.T principle from ACSM for exercise prescriptions  Reviews F.I.T.T. principles of resistance exercise including progression. Written material given at graduation. ? ?  ?Education: Exercise & Equipment Safety: ?- Individual verbal instruction and demonstration of equipment use and safety with use of the equipment. ?Flowsheet Row Cardiac Rehab from 10/12/2021 in [facility] Cardiac and Pulmonary  Rehab  ?Education need identified 10/12/21  ?Date 10/12/21  ?Educator KL  ?Instruction Review Code 1- Verbalizes Understanding  ? ?  ? ? ?Education: Exercise Physiology & General Exercise Guidelines: ?- Group verbal and written instruction with models to review the exercise physiology of the cardiovascular system and associated critical values. Provides general exercise guidelines with specific guidelines to those with heart or lung disease.  ? ? ?Education: Flexibility, Balance, Mind/Body Relaxation: ?- Group verbal and visual presentation with interactive activity on the components of exercise prescription. Introduces F.I.T.T principle from ACSM for exercise prescriptions. Reviews F.I.T.T. principles of flexibility and balance exercise training including progression. Also discusses the mind body connection.  Reviews various relaxation techniques to help reduce and manage stress (i.e. Deep breathing, progressive muscle relaxation, and visualization). Balance handout provided to take home. Written material given at graduation. ? ? ?Activity Barriers & Risk Stratification: ? Activity Barriers & Cardiac Risk Stratification - 10/12/21 1128   ? ?  ? Activity Barriers & Cardiac Risk Stratification  ? Activity Barriers Incisional Pain;Back Problems   ? Cardiac Risk Stratification High   ? ?  ?  ? ?  ? ? ?6 Minute Walk: ? 6 Minute Walk   ? ? [place] Name 10/12/21 1407  ?  ?  ?  ? 6 Minute Walk  ? Phase Initial    ? Distance 1350 feet    ? Walk Time 6 minutes    ? # of Rest Breaks 0    ? MPH 2.55    ? METS 3.14    ? RPE 7    ? Perceived Dyspnea  1    ? VO2 Peak 11.01    ? Symptoms No    ? Resting HR 65 bpm    ? Resting BP 158/86    ? Resting Oxygen Saturation  98 %    ? Exercise Oxygen Saturation  during 6 min walk 98 %    ? Max Ex. HR 83 bpm    ? Max Ex. BP 172/88    ? 2 Minute Post BP 152/84    ? ?  ?  ? ?  ? ? ?Oxygen Initial Assessment: ? ? ?Oxygen Re-Evaluation: ? ? ?Oxygen Discharge (Final Oxygen  Re-Evaluation): ? ? ?Initial Exercise Prescription: ? Initial Exercise Prescription - 10/12/21 1400   ? ?  ? Date of Initial Exercise RX and Referring Provider  ? Date 10/12/21   ? Referring Provider Donnelly Angelica MD   ?  ? Oxygen  ? Maintain Oxygen Saturation 88% or higher   ?  ? Treadmill  ? MPH 2.7   ? Grade 1   ? Minutes 15   ? METs 3.44   ?  ? NuStep  ? Level 3   ? SPM 80   ? Minutes 15   ? METs 3   ?  ?  REL-XR  ? Level 2   ? Speed 50   ? Minutes 15   ? METs 3   ?  ? T5 Nustep  ? Level 2   ? SPM 80   ? Minutes 15   ? METs 3   ?  ? Prescription Details  ? Frequency (times per week) 3   ? Duration Progress to 30 minutes of continuous aerobic without signs/symptoms of physical distress   ?  ? Intensity  ? THRR 40-80% of Max Heartrate 101- 137   ? Ratings of Perceived Exertion 11-13   ? Perceived Dyspnea 0-4   ?  ? Progression  ? Progression Continue to progress workloads to maintain intensity without signs/symptoms of physical distress.   ?  ? Resistance Training  ? Training Prescription Yes   ? Weight 4 lb   ? Reps 10-15   ? ?  ?  ? ?  ? ? ?Perform Capillary Blood Glucose checks as needed. ? ?Exercise Prescription Changes: ? ? Exercise Prescription Changes   ? ? [place] Name 10/12/21 1400  ?  ?  ?  ?  ?  ? Response to Exercise  ? Blood Pressure (Admit) 158/86      ? Blood Pressure (Exercise) 172/88      ? Blood Pressure (Exit) 152/84      ? Heart Rate (Admit) 65 bpm      ? Heart Rate (Exercise) 83 bpm      ? Heart Rate (Exit) 62 bpm      ? Oxygen Saturation (Admit) 98 %      ? Oxygen Saturation (Exercise) 98 %      ? Rating of Perceived Exertion (Exercise) 7      ? Perceived Dyspnea (Exercise) 1      ? Symptoms none      ? Comments walk test results      ? ?  ?  ? ?  ? ? ?Exercise Comments: ? ? Exercise Comments   ? ? [place] Name 10/16/21 6712  ?  ?  ?  ?  ? Exercise Comments First full day of exercise!  Patient was oriented to gym and equipment including functions, settings, policies, and procedures.  Patient's  individual exercise prescription and treatment plan were reviewed.  All starting workloads were established based on the results of the 6 minute walk test done at initial orientation visit.  The plan for exercise progression was also

## 2021-10-27 ENCOUNTER — Encounter: Payer: Medicare Other | Admitting: *Deleted

## 2021-10-27 ENCOUNTER — Other Ambulatory Visit: Payer: Self-pay

## 2021-10-27 DIAGNOSIS — Z951 Presence of aortocoronary bypass graft: Secondary | ICD-10-CM

## 2021-10-27 NOTE — Progress Notes (Signed)
Daily Session Note ? ?Patient Details  ?Name: Ernest Pennington ?MRN: [id number] ?Date of Birth: 09/06/1955 ?Referring Provider:   ?Flowsheet Row Cardiac Rehab from 10/12/2021 in [facility] Cardiac and Pulmonary Rehab  ?Referring Provider Orgel, Ryan MD  ? ?  ? ? ?Encounter Date: 10/27/2021 ? ?Check In: ? Session Check In - 10/27/21 0845   ? ?  ? Check-In  ? Supervising physician immediately available to respond to emergencies See telemetry face sheet for immediately available ER MD   ? Location [facility]-Cardiac & Pulmonary Rehab   ? Staff Present Susanne Bice, RN, BSN, CCRP;Joseph Hood, RCP,RRT,BSRT;Jessica Hawkins, MA, RCEP, CCRP, CCET   ? Virtual Visit No   ? Medication changes reported     No   ? Fall or balance concerns reported    No   ? Warm-up and Cool-down Performed on first and last piece of equipment   ? Resistance Training Performed Yes   ? VAD Patient? No   ? PAD/SET Patient? No   ?  ? Pain Assessment  ? Currently in Pain? No/denies   ? ?  ?  ? ?  ? ? ? ? ? ?Social History  ? ?Tobacco Use  ?Smoking Status Former  ?Smokeless Tobacco Never  ? ? ?Goals Met:  ?Independence with exercise equipment ?Exercise tolerated well ?No report of concerns or symptoms today ? ?Goals Unmet:  ?Not Applicable ? ?Comments: Pt able to follow exercise prescription today without complaint.  Will continue to monitor for progression. ? ? ? ?Dr. Mark Miller is Medical Director for [facility].  ?Dr. Fuad Aleskerov is Medical Director for [facility] Pulmonary Rehabilitation. ?

## 2021-10-30 ENCOUNTER — Other Ambulatory Visit: Payer: Self-pay

## 2021-10-30 ENCOUNTER — Encounter: Payer: Medicare Other | Admitting: *Deleted

## 2021-10-30 DIAGNOSIS — Z951 Presence of aortocoronary bypass graft: Secondary | ICD-10-CM

## 2021-10-30 NOTE — Progress Notes (Signed)
Daily Session Note ? ?Patient Details  ?Name: Ernest Pennington ?MRN: [id number] ?Date of Birth: June 25, 1956 ?Referring Provider:   ?Flowsheet Row Cardiac Rehab from 10/12/2021 in [facility] Cardiac and Pulmonary Rehab  ?Referring Provider Donnelly Angelica MD  ? ?  ? ? ?Encounter Date: 10/30/2021 ? ?Check In: ? Session Check In - 10/30/21 0841   ? ?  ? Check-In  ? Supervising physician immediately available to respond to emergencies See telemetry face sheet for immediately available ER MD   ? Location [facility]-Cardiac & Pulmonary Rehab   ? Staff Present Heath Lark, RN, BSN, Laveda Norman, BS, ACSM CEP, Exercise Physiologist;Joseph [place], [place]   ? Virtual Visit No   ? Medication changes reported     No   ? Fall or balance concerns reported    No   ? Warm-up and Cool-down Performed on first and last piece of equipment   ? Resistance Training Performed Yes   ? VAD Patient? No   ? PAD/SET Patient? No   ?  ? Pain Assessment  ? Currently in Pain? No/denies   ? ?  ?  ? ?  ? ? ? ? ? ?Social History  ? ?Tobacco Use  ?Smoking Status Former  ?Smokeless Tobacco Never  ? ? ?Goals Met:  ?Independence with exercise equipment ?Exercise tolerated well ?No report of concerns or symptoms today ? ?Goals Unmet:  ?Not Applicable ? ?Comments: Pt able to follow exercise prescription today without complaint.  Will continue to monitor for progression. ? ? ? ?Dr. Emily Filbert is Medical Director for [place].  ?Dr. Ottie Glazier is Medical Director for [facility] Pulmonary Rehabilitation. ?

## 2021-11-01 ENCOUNTER — Other Ambulatory Visit: Payer: Self-pay

## 2021-11-01 DIAGNOSIS — Z951 Presence of aortocoronary bypass graft: Secondary | ICD-10-CM

## 2021-11-01 NOTE — Progress Notes (Signed)
Daily Session Note ? ?Patient Details  ?Name: Ernest Pennington ?MRN: [id number] ?Date of Birth: [birth year] ?Referring Provider:   ?Flowsheet Row Cardiac Rehab from 10/12/2021 in [facility] Cardiac and Pulmonary Rehab  ?Referring Provider Donnelly Angelica MD  ? ?  ? ? ?Encounter Date: 11/01/2021 ? ?Check In: ? Session Check In - 11/01/21 0734   ? ?  ? Check-In  ? Supervising physician immediately available to respond to emergencies See telemetry face sheet for immediately available ER MD   ? Location [facility]-Cardiac & Pulmonary Rehab   ? Staff Present Birdie Sons, MPA, RN;Amanda Sommer, BA, ACSM CEP, Exercise Physiologist;Joseph [place], [place]   ? Virtual Visit No   ? Medication changes reported     No   ? Fall or balance concerns reported    No   ? Warm-up and Cool-down Performed on first and last piece of equipment   ? Resistance Training Performed Yes   ? VAD Patient? No   ? PAD/SET Patient? No   ?  ? Pain Assessment  ? Currently in Pain? No/denies   ? ?  ?  ? ?  ? ? ? ? ? ?Social History  ? ?Tobacco Use  ?Smoking Status Former  ?Smokeless Tobacco Never  ? ? ?Goals Met:  ?Independence with exercise equipment ?Exercise tolerated well ?No report of concerns or symptoms today ?Strength training completed today ? ?Goals Unmet:  ?Not Applicable ? ?Comments: Pt able to follow exercise prescription today without complaint.  Will continue to monitor for progression. ? ? ? ?Dr. Emily Filbert is Medical Director for [place].  ?Dr. Ottie Glazier is Medical Director for [facility] Pulmonary Rehabilitation. ?

## 2021-11-06 ENCOUNTER — Encounter: Payer: Medicare Other | Attending: Cardiology | Admitting: *Deleted

## 2021-11-06 DIAGNOSIS — Z951 Presence of aortocoronary bypass graft: Secondary | ICD-10-CM | POA: Diagnosis present

## 2021-11-06 NOTE — Progress Notes (Signed)
Daily Session Note ? ?Patient Details  ?Name: Tylique Aull ?MRN: [id number] ?Date of Birth: 11-21-1955 ?Referring Provider:   ?Flowsheet Row Cardiac Rehab from 10/12/2021 in [facility] Cardiac and Pulmonary Rehab  ?Referring Provider Donnelly Angelica MD  ? ?  ? ? ?Encounter Date: 11/06/2021 ? ?Check In: ? Session Check In - 11/06/21 0804   ? ?  ? Check-In  ? Supervising physician immediately available to respond to emergencies See telemetry face sheet for immediately available ER MD   ? Location [facility]-Cardiac & Pulmonary Rehab   ? Staff Present Heath Lark, RN, BSN, Laveda Norman, BS, ACSM CEP, Exercise Physiologist;Joseph [place], [place]   ? Virtual Visit No   ? Medication changes reported     No   ? Fall or balance concerns reported    No   ? Warm-up and Cool-down Performed on first and last piece of equipment   ? Resistance Training Performed Yes   ? VAD Patient? No   ? PAD/SET Patient? No   ?  ? Pain Assessment  ? Currently in Pain? No/denies   ? ?  ?  ? ?  ? ? ? ? ? ?Social History  ? ?Tobacco Use  ?Smoking Status Former  ?Smokeless Tobacco Never  ? ? ?Goals Met:  ?Independence with exercise equipment ?Exercise tolerated well ?No report of concerns or symptoms today ? ?Goals Unmet:  ?Not Applicable ? ?Comments: Pt able to follow exercise prescription today without complaint.  Will continue to monitor for progression. ? ? ? ?Dr. Emily Filbert is Medical Director for [place].  ?Dr. Ottie Glazier is Medical Director for [facility] Pulmonary Rehabilitation. ?

## 2021-11-08 DIAGNOSIS — Z951 Presence of aortocoronary bypass graft: Secondary | ICD-10-CM | POA: Diagnosis not present

## 2021-11-08 NOTE — Progress Notes (Signed)
Daily Session Note ? ?Patient Details  ?Name: Ernest Pennington ?MRN: [id number] ?Date of Birth: 1956/07/10 ?Referring Provider:   ?Flowsheet Row Cardiac Rehab from 10/12/2021 in [facility] Cardiac and Pulmonary Rehab  ?Referring Provider Donnelly Angelica MD  ? ?  ? ? ?Encounter Date: 11/08/2021 ? ?Check In: ? Session Check In - 11/08/21 0742   ? ?  ? Check-In  ? Supervising physician immediately available to respond to emergencies See telemetry face sheet for immediately available ER MD   ? Location [facility]-Cardiac & Pulmonary Rehab   ? Staff Present Birdie Sons, MPA, RN;Jessica Luan [place], MA, RCEP, CCRP, CCET;Joseph [place], RCP,RRT,BSRT;Melissa [place], [place], LDN   ? Virtual Visit No   ? Medication changes reported     No   ? Fall or balance concerns reported    No   ? Warm-up and Cool-down Performed on first and last piece of equipment   ? Resistance Training Performed Yes   ? VAD Patient? No   ? PAD/SET Patient? No   ?  ? Pain Assessment  ? Currently in Pain? No/denies   ? ?  ?  ? ?  ? ? ? ? ? ?Social History  ? ?Tobacco Use  ?Smoking Status Former  ?Smokeless Tobacco Never  ? ? ?Goals Met:  ?Independence with exercise equipment ?Exercise tolerated well ?No report of concerns or symptoms today ?Strength training completed today ? ?Goals Unmet:  ?Not Applicable ? ?Comments: Pt able to follow exercise prescription today without complaint.  Will continue to monitor for progression. ? ? ? ?Dr. Emily Filbert is Medical Director for [place].  ?Dr. Ottie Glazier is Medical Director for [facility] Pulmonary Rehabilitation. ?

## 2021-11-10 ENCOUNTER — Encounter: Payer: Medicare Other | Admitting: *Deleted

## 2021-11-10 DIAGNOSIS — Z951 Presence of aortocoronary bypass graft: Secondary | ICD-10-CM | POA: Diagnosis not present

## 2021-11-10 NOTE — Progress Notes (Signed)
Daily Session Note ? ?Patient Details  ?Name: Ernest Pennington ?MRN: [id number] ?Date of Birth: August 02, 1956 ?Referring Provider:   ?Flowsheet Row Cardiac Rehab from 10/12/2021 in [facility] Cardiac and Pulmonary Rehab  ?Referring Provider Donnelly Angelica MD  ? ?  ? ? ?Encounter Date: 11/10/2021 ? ?Check In: ? Session Check In - 11/10/21 0840   ? ?  ? Check-In  ? Supervising physician immediately available to respond to emergencies See telemetry face sheet for immediately available ER MD   ? Location [facility]-Cardiac & Pulmonary Rehab   ? Staff Present Heath Lark, RN, BSN, CCRP;Laureen Owens [place]ark, BS, RRT, CPFT;Joseph [place], [place]   ? Virtual Visit No   ? Medication changes reported     No   ? Fall or balance concerns reported    No   ? Warm-up and Cool-down Performed on first and last piece of equipment   ? Resistance Training Performed Yes   ? VAD Patient? No   ? PAD/SET Patient? No   ?  ? Pain Assessment  ? Currently in Pain? No/denies   ? ?  ?  ? ?  ? ? ? ? ? ?Social History  ? ?Tobacco Use  ?Smoking Status Former  ?Smokeless Tobacco Never  ? ? ?Goals Met:  ?Independence with exercise equipment ?Exercise tolerated well ?No report of concerns or symptoms today ? ?Goals Unmet:  ?Not Applicable ? ?Comments: Pt able to follow exercise prescription today without complaint.  Will continue to monitor for progression. ? ? ? ?Dr. Emily Filbert is Medical Director for [place].  ?Dr. Ottie Glazier is Medical Director for [facility] Pulmonary Rehabilitation. ?

## 2021-11-13 ENCOUNTER — Encounter: Payer: Medicare Other | Admitting: *Deleted

## 2021-11-13 DIAGNOSIS — Z951 Presence of aortocoronary bypass graft: Secondary | ICD-10-CM

## 2021-11-13 NOTE — Progress Notes (Signed)
Daily Session Note ? ?Patient Details  ?Name: Ernest Pennington ?MRN: [id number] ?Date of Birth: [birth year] ?Referring Provider:   ?Flowsheet Row Cardiac Rehab from 10/12/2021 in [facility] Cardiac and Pulmonary Rehab  ?Referring Provider Donnelly Angelica MD  ? ?  ? ? ?Encounter Date: 11/13/2021 ? ?Check In: ? Session Check In - 11/13/21 0842   ? ?  ? Check-In  ? Supervising physician immediately available to respond to emergencies See telemetry face sheet for immediately available ER MD   ? Location [facility]-Cardiac & Pulmonary Rehab   ? Staff Present Heath Lark, RN, BSN, CCRP;Joseph [place], RCP,RRT,BSRT;Kelly [place], [place], ACSM CEP, Exercise Physiologist;Laureen Owens Shark, [place], RRT, CPFT   ? Virtual Visit No   ? Medication changes reported     No   ? Fall or balance concerns reported    No   ? Warm-up and Cool-down Performed on first and last piece of equipment   ? Resistance Training Performed Yes   ? VAD Patient? No   ? PAD/SET Patient? No   ?  ? Pain Assessment  ? Currently in Pain? No/denies   ? ?  ?  ? ?  ? ? ? ? ? ?Social History  ? ?Tobacco Use  ?Smoking Status Former  ?Smokeless Tobacco Never  ? ? ?Goals Met:  ?Independence with exercise equipment ?Exercise tolerated well ?No report of concerns or symptoms today ? ?Goals Unmet:  ?Not Applicable ? ?Comments: Pt able to follow exercise prescription today without complaint.  Will continue to monitor for progression. ? ? ? ?Dr. Emily Filbert is Medical Director for [place].  ?Dr. Ottie Glazier is Medical Director for [facility] Pulmonary Rehabilitation. ?

## 2021-11-15 ENCOUNTER — Encounter: Payer: Medicare Other | Admitting: *Deleted

## 2021-11-15 DIAGNOSIS — Z951 Presence of aortocoronary bypass graft: Secondary | ICD-10-CM

## 2021-11-15 NOTE — Progress Notes (Signed)
Daily Session Note ? ?Patient Details  ?Name: Ernest Pennington ?MRN: [id number] ?Date of Birth: 11-14-1955 ?Referring Provider:   ?Flowsheet Row Cardiac Rehab from 10/12/2021 in [facility] Cardiac and Pulmonary Rehab  ?Referring Provider Donnelly Angelica MD  ? ?  ? ? ?Encounter Date: 11/15/2021 ? ?Check In: ? Session Check In - 11/15/21 0933   ? ?  ? Check-In  ? Supervising physician immediately available to respond to emergencies See telemetry face sheet for immediately available ER MD   ? Location [facility]-Cardiac & Pulmonary Rehab   ? Staff Present Hope Budds, RDN, LDN;Amanda Sommer, BA, ACSM CEP, Exercise Physiologist;Faline Langer, RN, BSN, CCRP   ? Virtual Visit No   ? Medication changes reported     No   ? Fall or balance concerns reported    No   ? Warm-up and Cool-down Performed on first and last piece of equipment   ? Resistance Training Performed Yes   ? VAD Patient? No   ? PAD/SET Patient? No   ?  ? Pain Assessment  ? Currently in Pain? No/denies   ? ?  ?  ? ?  ? ? ? ? ? ?Social History  ? ?Tobacco Use  ?Smoking Status Former  ?Smokeless Tobacco Never  ? ? ?Goals Met:  ?Independence with exercise equipment ?Exercise tolerated well ?No report of concerns or symptoms today ? ?Goals Unmet:  ?Not Applicable ? ?Comments: Pt able to follow exercise prescription today without complaint.  Will continue to monitor for progression. ? ? ? ?Dr. Emily Filbert is Medical Director for [place].  ?Dr. Ottie Glazier is Medical Director for [facility] Pulmonary Rehabilitation. ?

## 2021-11-17 ENCOUNTER — Encounter: Payer: Medicare Other | Admitting: *Deleted

## 2021-11-17 DIAGNOSIS — Z951 Presence of aortocoronary bypass graft: Secondary | ICD-10-CM

## 2021-11-17 NOTE — Progress Notes (Signed)
Daily Session Note ? ?Patient Details  ?Name: Dina Mobley ?MRN: [id number] ?Date of Birth: 1956/07/22 ?Referring Provider:   ?Flowsheet Row Cardiac Rehab from 10/12/2021 in [facility] Cardiac and Pulmonary Rehab  ?Referring Provider Donnelly Angelica MD  ? ?  ? ? ?Encounter Date: 11/17/2021 ? ?Check In: ? Session Check In - 11/17/21 0829   ? ?  ? Check-In  ? Supervising physician immediately available to respond to emergencies See telemetry face sheet for immediately available ER MD   ? Location [facility]-Cardiac & Pulmonary Rehab   ? Staff Present Heath Lark, RN, BSN, CCRP;Melissa [place], RDN, LDN;Joseph [place], [place]   ? Virtual Visit No   ? Medication changes reported     No   ? Fall or balance concerns reported    No   ? Warm-up and Cool-down Performed on first and last piece of equipment   ? Resistance Training Performed Yes   ? VAD Patient? No   ? PAD/SET Patient? No   ?  ? Pain Assessment  ? Currently in Pain? No/denies   ? ?  ?  ? ?  ? ? ? ? ? ?Social History  ? ?Tobacco Use  ?Smoking Status Former  ?Smokeless Tobacco Never  ? ? ?Goals Met:  ?Independence with exercise equipment ?Exercise tolerated well ?No report of concerns or symptoms today ? ?Goals Unmet:  ?Not Applicable ? ?Comments: Pt able to follow exercise prescription today without complaint.  Will continue to monitor for progression. ? ? ? ?Dr. Emily Filbert is Medical Director for [place].  ?Dr. Ottie Glazier is Medical Director for [facility] Pulmonary Rehabilitation. ?

## 2021-11-20 ENCOUNTER — Encounter: Payer: Medicare Other | Admitting: *Deleted

## 2021-11-20 DIAGNOSIS — Z951 Presence of aortocoronary bypass graft: Secondary | ICD-10-CM | POA: Diagnosis not present

## 2021-11-20 NOTE — Progress Notes (Signed)
Daily Session Note ? ?Patient Details  ?Name: Ernest Pennington ?MRN: [id number] ?Date of Birth: 1956/01/08 ?Referring Provider:   ?Flowsheet Row Cardiac Rehab from 10/12/2021 in [facility] Cardiac and Pulmonary Rehab  ?Referring Provider Donnelly Angelica MD  ? ?  ? ? ?Encounter Date: 11/20/2021 ? ?Check In: ? Session Check In - 11/20/21 0836   ? ?  ? Check-In  ? Supervising physician immediately available to respond to emergencies See telemetry face sheet for immediately available ER MD   ? Location [facility]-Cardiac & Pulmonary Rehab   ? Staff Present Heath Lark, RN, BSN, Laveda Norman, BS, ACSM CEP, Exercise Physiologist;Joseph [employer], [place]   ? Virtual Visit No   ? Medication changes reported     No   ? Fall or balance concerns reported    No   ? Warm-up and Cool-down Performed on first and last piece of equipment   ? Resistance Training Performed Yes   ? VAD Patient? No   ? PAD/SET Patient? No   ?  ? Pain Assessment  ? Currently in Pain? No/denies   ? ?  ?  ? ?  ? ? ? ? ? ?Social History  ? ?Tobacco Use  ?Smoking Status Former  ?Smokeless Tobacco Never  ? ? ?Goals Met:  ?Independence with exercise equipment ?Exercise tolerated well ?No report of concerns or symptoms today ? ?Goals Unmet:  ?Not Applicable ? ?Comments: Pt able to follow exercise prescription today without complaint.  Will continue to monitor for progression. ? ? ? ?Dr. Emily Filbert is Medical Director for [place].  ?Dr. Ottie Glazier is Medical Director for [facility] Pulmonary Rehabilitation. ?

## 2021-11-22 ENCOUNTER — Encounter: Payer: Self-pay | Admitting: *Deleted

## 2021-11-22 DIAGNOSIS — Z951 Presence of aortocoronary bypass graft: Secondary | ICD-10-CM

## 2021-11-22 NOTE — Progress Notes (Signed)
Cardiac Individual Treatment Plan ? ?Patient Details  ?Name: Ernest Pennington ?MRN: [id number] ?Date of Birth: 06-23-1956 ?Referring Provider:   ?Flowsheet Row Cardiac Rehab from 10/12/2021 in [facility] Cardiac and Pulmonary Rehab  ?Referring Provider Donnelly Angelica MD  ? ?  ? ? ?Initial Encounter Date:  ?Flowsheet Row Cardiac Rehab from 10/12/2021 in [facility] Cardiac and Pulmonary Rehab  ?Date 10/12/21  ? ?  ? ? ?Visit Diagnosis: S/P CABG x 4 ? ?Patient's Home Medications on Admission: ? ?Current Outpatient Medications:  ?  allopurinol (ZYLOPRIM) 300 MG tablet, Take 300 mg by mouth daily.   (Patient not taking: Reported on 09/27/2021), Disp: , Rfl:  ?  apixaban (ELIQUIS) 5 MG TABS tablet, Take 5 mg by mouth 2 (two) times daily., Disp: , Rfl:  ?  aspirin 81 MG tablet, Take 81 mg by mouth daily.  , Disp: , Rfl:  ?  bumetanide (BUMEX) 0.5 MG tablet, Take by mouth., Disp: , Rfl:  ?  cloNIDine (CATAPRES) 0.1 MG tablet, Take 0.1 mg by mouth 2 (two) times daily. (Patient not taking: Reported on 09/27/2021), Disp: , Rfl:  ?  diltiazem (CARDIZEM CD) 240 MG 24 hr capsule, Take 1 capsule (240 mg total) by mouth daily. (Patient not taking: Reported on 09/27/2021), Disp: 30 capsule, Rfl: 11 ?  empagliflozin (JARDIANCE) 10 MG TABS tablet, Take 1 tablet by mouth daily., Disp: , Rfl:  ?  metoprolol tartrate (LOPRESSOR) 25 MG tablet, Take by mouth., Disp: , Rfl:  ?  rosuvastatin (CRESTOR) 10 MG tablet, Take 10 mg by mouth daily., Disp: , Rfl:  ?  sitaGLIPtin (JANUVIA) 50 MG tablet, Take 1 tablet (50 mg total) by mouth daily., Disp: 90 tablet, Rfl: 3 ?  spironolactone (ALDACTONE) 50 MG tablet, Take 1 tablet (50 mg total) by mouth daily. (Patient not taking: Reported on 09/27/2021), Disp: 90 tablet, Rfl: 3 ?  traZODone (DESYREL) 50 MG tablet, Take 50 mg by mouth at bedtime as needed for sleep., Disp: , Rfl:  ? ?Past Medical History: ?Past Medical History:  ?Diagnosis Date  ? ATRIAL SEPTAL DEFECT   ? Back pain   ? CAD   ? Diverticulitis   ? History of  rectal bleeding   ? HYPERLIPIDEMIA   ? HYPERTENSION   ? Pre-diabetes   ? ? ?Tobacco Use: ?Social History  ? ?Tobacco Use  ?Smoking Status Former  ?Smokeless Tobacco Never  ? ? ?Labs: ?Review Flowsheet   ? ?  ?  Latest Ref Rng & Units 10/17/2011  ?Labs for [facility] Cardiac and Pulmonary Rehab  ?Cholestrol 0 - 200 mg/dL 116    ?LDL (calc) 0 - 100 mg/dL 67    ?HDL-C 40 - 60 mg/dL 34    ?Trlycerides 0 - 200 mg/dL 75    ?Hemoglobin A1c 4.2 - 6.3 % 7.5    ?  ? ? Multiple values from one day are sorted in reverse-chronological order  ?  ?  ? ? ? ?Exercise Target Goals: ?Exercise Program Goal: ?Individual exercise prescription set using results from initial 6 min walk test and THRR while considering  patient?s activity barriers and safety.  ? ?Exercise Prescription Goal: ?Initial exercise prescription builds to 30-45 minutes a day of aerobic activity, 2-3 days per week.  Home exercise guidelines will be given to patient during program as part of exercise prescription that the participant will acknowledge. ? ? ?Education: Aerobic Exercise: ?- Group verbal and visual presentation on the components of exercise prescription. Introduces F.I.T.T principle from ACSM for  exercise prescriptions.  Reviews F.I.T.T. principles of aerobic exercise including progression. Written material given at graduation. ?Flowsheet Row Cardiac Rehab from 10/12/2021 in [facility] Cardiac and Pulmonary Rehab  ?Education need identified 10/12/21  ? ?  ? ? ?Education: Resistance Exercise: ?- Group verbal and visual presentation on the components of exercise prescription. Introduces F.I.T.T principle from ACSM for exercise prescriptions  Reviews F.I.T.T. principles of resistance exercise including progression. Written material given at graduation. ? ?  ?Education: Exercise & Equipment Safety: ?- Individual verbal instruction and demonstration of equipment use and safety with use of the equipment. ?Flowsheet Row Cardiac Rehab from 10/12/2021 in [facility] Cardiac and Pulmonary  Rehab  ?Education need identified 10/12/21  ?Date 10/12/21  ?Educator KL  ?Instruction Review Code 1- Verbalizes Understanding  ? ?  ? ? ?Education: Exercise Physiology & General Exercise Guidelines: ?- Group verbal and written instruction with models to review the exercise physiology of the cardiovascular system and associated critical values. Provides general exercise guidelines with specific guidelines to those with heart or lung disease.  ? ? ?Education: Flexibility, Balance, Mind/Body Relaxation: ?- Group verbal and visual presentation with interactive activity on the components of exercise prescription. Introduces F.I.T.T principle from ACSM for exercise prescriptions. Reviews F.I.T.T. principles of flexibility and balance exercise training including progression. Also discusses the mind body connection.  Reviews various relaxation techniques to help reduce and manage stress (i.e. Deep breathing, progressive muscle relaxation, and visualization). Balance handout provided to take home. Written material given at graduation. ? ? ?Activity Barriers & Risk Stratification: ? Activity Barriers & Cardiac Risk Stratification - 10/12/21 1128   ? ?  ? Activity Barriers & Cardiac Risk Stratification  ? Activity Barriers Incisional Pain;Back Problems   ? Cardiac Risk Stratification High   ? ?  ?  ? ?  ? ? ?6 Minute Walk: ? 6 Minute Walk   ? ? [place] Name 10/12/21 1407  ?  ?  ?  ? 6 Minute Walk  ? Phase Initial    ? Distance 1350 feet    ? Walk Time 6 minutes    ? # of Rest Breaks 0    ? MPH 2.55    ? METS 3.14    ? RPE 7    ? Perceived Dyspnea  1    ? VO2 Peak 11.01    ? Symptoms No    ? Resting HR 65 bpm    ? Resting BP 158/86    ? Resting Oxygen Saturation  98 %    ? Exercise Oxygen Saturation  during 6 min walk 98 %    ? Max Ex. HR 83 bpm    ? Max Ex. BP 172/88    ? 2 Minute Post BP 152/84    ? ?  ?  ? ?  ? ? ?Oxygen Initial Assessment: ? ? ?Oxygen Re-Evaluation: ? ? ?Oxygen Discharge (Final Oxygen  Re-Evaluation): ? ? ?Initial Exercise Prescription: ? Initial Exercise Prescription - 10/12/21 1400   ? ?  ? Date of Initial Exercise RX and Referring Provider  ? Date 10/12/21   ? Referring Provider Donnelly Angelica MD   ?  ? Oxygen  ? Maintain Oxygen Saturation 88% or higher   ?  ? Treadmill  ? MPH 2.7   ? Grade 1   ? Minutes 15   ? METs 3.44   ?  ? NuStep  ? Level 3   ? SPM 80   ? Minutes 15   ? METs 3   ?  ?  REL-XR  ? Level 2   ? Speed 50   ? Minutes 15   ? METs 3   ?  ? T5 Nustep  ? Level 2   ? SPM 80   ? Minutes 15   ? METs 3   ?  ? Prescription Details  ? Frequency (times per week) 3   ? Duration Progress to 30 minutes of continuous aerobic without signs/symptoms of physical distress   ?  ? Intensity  ? THRR 40-80% of Max Heartrate 101- 137   ? Ratings of Perceived Exertion 11-13   ? Perceived Dyspnea 0-4   ?  ? Progression  ? Progression Continue to progress workloads to maintain intensity without signs/symptoms of physical distress.   ?  ? Resistance Training  ? Training Prescription Yes   ? Weight 4 lb   ? Reps 10-15   ? ?  ?  ? ?  ? ? ?Perform Capillary Blood Glucose checks as needed. ? ?Exercise Prescription Changes: ? ? Exercise Prescription Changes   ? ? [place] Name 10/12/21 1400 10/30/21 1200 11/22/21 0800  ?  ?  ?  ? Response to Exercise  ? Blood Pressure (Admit) 158/86 138/80 --    ? Blood Pressure (Exercise) 172/88 128/64 --    ? Blood Pressure (Exit) 152/84 138/72 --    ? Heart Rate (Admit) 65 bpm 57 bpm --    ? Heart Rate (Exercise) 83 bpm 81 bpm --    ? Heart Rate (Exit) 62 bpm 56 bpm --    ? Oxygen Saturation (Admit) 98 % -- --    ? Oxygen Saturation (Exercise) 98 % -- --    ? Rating of Perceived Exertion (Exercise) 7 15 --    ? Perceived Dyspnea (Exercise) 1 -- --    ? Symptoms none none --    ? Comments walk test results 2nd full week of exercise --    ? Duration -- Continue with 30 min of aerobic exercise without signs/symptoms of physical distress. --    ? Intensity -- THRR unchanged --    ?  ?  Progression  ? Progression -- Continue to progress workloads to maintain intensity without signs/symptoms of physical distress. --    ? Average METs -- 3.22 --    ?  ? Resistance Training  ? Training Prescription -- Yes --    ?

## 2021-11-22 NOTE — Progress Notes (Signed)
Daily Session Note ? ?Patient Details  ?Name: Devyn Sheerin ?MRN: [id number] ?Date of Birth: 1955-12-12 ?Referring Provider:   ?Flowsheet Row Cardiac Rehab from 10/12/2021 in [facility] Cardiac and Pulmonary Rehab  ?Referring Provider Donnelly Angelica MD  ? ?  ? ? ?Encounter Date: 11/22/2021 ? ?Check In: ? Session Check In - 11/22/21 0749   ? ?  ? Check-In  ? Supervising physician immediately available to respond to emergencies See telemetry face sheet for immediately available ER MD   ? Location [facility]-Cardiac & Pulmonary Rehab   ? Staff Present Birdie Sons, MPA, RN;Amanda Sommer, BA, ACSM CEP, Exercise Physiologist;Joseph [place], [place]   ? Virtual Visit No   ? Medication changes reported     No   ? Fall or balance concerns reported    No   ? Warm-up and Cool-down Performed on first and last piece of equipment   ? Resistance Training Performed Yes   ? VAD Patient? No   ? PAD/SET Patient? No   ?  ? Pain Assessment  ? Currently in Pain? No/denies   ? ?  ?  ? ?  ? ? ? ? ? ?Social History  ? ?Tobacco Use  ?Smoking Status Former  ?Smokeless Tobacco Never  ? ? ?Goals Met:  ?Independence with exercise equipment ?Exercise tolerated well ?No report of concerns or symptoms today ?Strength training completed today ? ?Goals Unmet:  ?Not Applicable ? ?Comments: Pt able to follow exercise prescription today without complaint.  Will continue to monitor for progression. ? ?Reviewed home exercise with pt today.  Pt plans to walk, treadmill, bike, and weights at home for exercise.  Reviewed THR, pulse, RPE, sign and symptoms, pulse oximetery and when to call 911 or MD.  Also discussed weather considerations and indoor options.  Pt voiced understanding. ? ? ?Dr. Emily Filbert is Medical Director for [place].  ?Dr. Ottie Glazier is Medical Director for [facility] Pulmonary Rehabilitation. ?

## 2021-11-27 ENCOUNTER — Encounter: Payer: Medicare Other | Admitting: *Deleted

## 2021-11-27 DIAGNOSIS — Z951 Presence of aortocoronary bypass graft: Secondary | ICD-10-CM

## 2021-11-27 NOTE — Progress Notes (Signed)
Daily Session Note ? ?Patient Details  ?Name: Ernest Pennington ?MRN: [id number] ?Date of Birth: 10-25-1955 ?Referring Provider:   ?Flowsheet Row Cardiac Rehab from 10/12/2021 in [facility] Cardiac and Pulmonary Rehab  ?Referring Provider Donnelly Angelica MD  ? ?  ? ? ?Encounter Date: 11/27/2021 ? ?Check In: ? Session Check In - 11/27/21 0830   ? ?  ? Check-In  ? Supervising physician immediately available to respond to emergencies See telemetry face sheet for immediately available ER MD   ? Location [facility]-Cardiac & Pulmonary Rehab   ? Staff Present Heath Lark, RN, BSN, Laveda Norman, BS, ACSM CEP, Exercise Physiologist;Joseph [place], [place]   ? Virtual Visit No   ? Medication changes reported     No   ? Fall or balance concerns reported    No   ? Warm-up and Cool-down Performed on first and last piece of equipment   ? Resistance Training Performed Yes   ? VAD Patient? No   ? PAD/SET Patient? No   ?  ? Pain Assessment  ? Currently in Pain? No/denies   ? ?  ?  ? ?  ? ? ? ? ? ?Social History  ? ?Tobacco Use  ?Smoking Status Former  ?Smokeless Tobacco Never  ? ? ?Goals Met:  ?Independence with exercise equipment ?Exercise tolerated well ?No report of concerns or symptoms today ? ?Goals Unmet:  ?Not Applicable ? ?Comments: Pt able to follow exercise prescription today without complaint.  Will continue to monitor for progression. ? ? ? ?Dr. Emily Filbert is Medical Director for [place].  ?Dr. Ottie Glazier is Medical Director for [facility] Pulmonary Rehabilitation. ?

## 2021-11-29 DIAGNOSIS — Z951 Presence of aortocoronary bypass graft: Secondary | ICD-10-CM | POA: Diagnosis not present

## 2021-11-29 NOTE — Progress Notes (Signed)
Daily Session Note ? ?Patient Details  ?Name: Ernest Pennington ?MRN: [id number] ?Date of Birth: Feb 18, 1956 ?Referring Provider:   ?Flowsheet Row Cardiac Rehab from 10/12/2021 in [facility] Cardiac and Pulmonary Rehab  ?Referring Provider Donnelly Angelica MD  ? ?  ? ? ?Encounter Date: 11/29/2021 ? ?Check In: ? Session Check In - 11/29/21 0810   ? ?  ? Check-In  ? Supervising physician immediately available to respond to emergencies See telemetry face sheet for immediately available ER MD   ? Location [facility]-Cardiac & Pulmonary Rehab   ? Staff Present Birdie Sons, MPA, RN;Jessica [place], MA, RCEP, CCRP, Marylynn Pearson, MS, ASCM CEP, Exercise Physiologist;Joseph [place], [place]   ? Virtual Visit No   ? Medication changes reported     No   ? Fall or balance concerns reported    No   ? Warm-up and Cool-down Performed on first and last piece of equipment   ? Resistance Training Performed Yes   ? VAD Patient? No   ? PAD/SET Patient? No   ?  ? Pain Assessment  ? Currently in Pain? No/denies   ? ?  ?  ? ?  ? ? ? ? ? ?Social History  ? ?Tobacco Use  ?Smoking Status Former  ?Smokeless Tobacco Never  ? ? ?Goals Met:  ?Independence with exercise equipment ?Exercise tolerated well ?No report of concerns or symptoms today ?Strength training completed today ? ?Goals Unmet:  ?Not Applicable ? ?Comments: Pt able to follow exercise prescription today without complaint.  Will continue to monitor for progression. ? ? ? ?Dr. Emily Filbert is Medical Director for [place].  ?Dr. Ottie Glazier is Medical Director for [facility] Pulmonary Rehabilitation. ?

## 2021-12-01 ENCOUNTER — Encounter: Payer: Medicare Other | Admitting: *Deleted

## 2021-12-04 ENCOUNTER — Encounter: Payer: Medicare Other | Attending: Cardiology | Admitting: *Deleted

## 2021-12-04 DIAGNOSIS — Z951 Presence of aortocoronary bypass graft: Secondary | ICD-10-CM | POA: Diagnosis present

## 2021-12-04 DIAGNOSIS — Z48812 Encounter for surgical aftercare following surgery on the circulatory system: Secondary | ICD-10-CM | POA: Insufficient documentation

## 2021-12-04 NOTE — Progress Notes (Signed)
Daily Session Note ? ?Patient Details  ?Name: Ernest Pennington ?MRN: [id number] ?Date of Birth: [birth year] ?Referring Provider:   ?Flowsheet Row Cardiac Rehab from 10/12/2021 in [facility] Cardiac and Pulmonary Rehab  ?Referring Provider Ernest Angelica MD  ? ?  ? ? ?Encounter Date: 12/04/2021 ? ?Check In: ? Session Check In - 12/04/21 0840   ? ?  ? Check-In  ? Supervising physician immediately available to respond to emergencies See telemetry face sheet for immediately available ER MD   ? Location [facility]-Cardiac & Pulmonary Rehab   ? Staff Present Heath Lark, RN, BSN, CCRP;Joseph [place], RCP,RRT,BSRT;Kelly [place], [place], ACSM CEP, Exercise Physiologist   ? Virtual Visit No   ? Medication changes reported     No   ? Fall or balance concerns reported    No   ? Warm-up and Cool-down Performed on first and last piece of equipment   ? Resistance Training Performed Yes   ? VAD Patient? No   ? PAD/SET Patient? No   ?  ? Pain Assessment  ? Currently in Pain? No/denies   ? ?  ?  ? ?  ? ? ? ? ? ?Social History  ? ?Tobacco Use  ?Smoking Status Former  ?Smokeless Tobacco Never  ? ? ?Goals Met:  ?Independence with exercise equipment ?Exercise tolerated well ?No report of concerns or symptoms today ? ?Goals Unmet:  ?Not Applicable ? ?Comments: Pt able to follow exercise prescription today without complaint.  Will continue to monitor for progression. ? ? ? ?Dr. Emily Filbert is Medical Director for [name].  ?Dr. Ottie Glazier is Medical Director for [facility] Pulmonary Rehabilitation. ?

## 2021-12-06 DIAGNOSIS — Z951 Presence of aortocoronary bypass graft: Secondary | ICD-10-CM

## 2021-12-06 NOTE — Progress Notes (Signed)
Daily Session Note ? ?Patient Details  ?Name: Ernest Pennington ?MRN: [id number] ?Date of Birth: 06-22-1956 ?Referring Provider:   ?Flowsheet Row Cardiac Rehab from 10/12/2021 in [facility] Cardiac and Pulmonary Rehab  ?Referring Provider Donnelly Angelica MD  ? ?  ? ? ?Encounter Date: 12/06/2021 ? ?Check In: ? Session Check In - 12/06/21 0818   ? ?  ? Check-In  ? Supervising physician immediately available to respond to emergencies See telemetry face sheet for immediately available ER MD   ? Location [facility]-Cardiac & Pulmonary Rehab   ? Staff Present Birdie Sons, MPA, RN;Joseph [place], RCP,RRT,BSRT;Melissa [place], RDN, LDN   ? Virtual Visit No   ? Medication changes reported     No   ? Fall or balance concerns reported    No   ? Warm-up and Cool-down Performed on first and last piece of equipment   ? Resistance Training Performed Yes   ? VAD Patient? No   ? PAD/SET Patient? No   ?  ? Pain Assessment  ? Currently in Pain? No/denies   ? ?  ?  ? ?  ? ? ? ? ? ?Social History  ? ?Tobacco Use  ?Smoking Status Former  ?Smokeless Tobacco Never  ? ? ?Goals Met:  ?Independence with exercise equipment ?Exercise tolerated well ?No report of concerns or symptoms today ?Strength training completed today ? ?Goals Unmet:  ?Not Applicable ? ?Comments: Pt able to follow exercise prescription today without complaint.  Will continue to monitor for progression. ? ? ? ?Dr. Emily Filbert is Medical Director for [place].  ?Dr. Ottie Glazier is Medical Director for [facility] Pulmonary Rehabilitation. ?

## 2021-12-08 ENCOUNTER — Encounter: Payer: Medicare Other | Admitting: *Deleted

## 2021-12-08 DIAGNOSIS — Z951 Presence of aortocoronary bypass graft: Secondary | ICD-10-CM

## 2021-12-08 NOTE — Progress Notes (Signed)
Daily Session Note ? ?Patient Details  ?Name: Buckley Bradly ?MRN: [id number] ?Date of Birth: Aug 21, 1955 ?Referring Provider:   ?Flowsheet Row Cardiac Rehab from 10/12/2021 in [facility] Cardiac and Pulmonary Rehab  ?Referring Provider Donnelly Angelica MD  ? ?  ? ? ?Encounter Date: 12/08/2021 ? ?Check In: ? Session Check In - 12/08/21 0839   ? ?  ? Check-In  ? Supervising physician immediately available to respond to emergencies See telemetry face sheet for immediately available ER MD   ? Location [facility]-Cardiac & Pulmonary Rehab   ? Staff Present Heath Lark, RN, BSN, CCRP;Jessica [place], MA, RCEP, CCRP, CCET;Joseph [place], [place]   ? Virtual Visit No   ? Medication changes reported     No   ? Fall or balance concerns reported    No   ? Warm-up and Cool-down Performed on first and last piece of equipment   ? Resistance Training Performed Yes   ? VAD Patient? No   ? PAD/SET Patient? No   ?  ? Pain Assessment  ? Currently in Pain? No/denies   ? ?  ?  ? ?  ? ? ? ? ? ?Social History  ? ?Tobacco Use  ?Smoking Status Former  ?Smokeless Tobacco Never  ? ? ?Goals Met:  ?Independence with exercise equipment ?Exercise tolerated well ?No report of concerns or symptoms today ? ?Goals Unmet:  ?Not Applicable ? ?Comments: Pt able to follow exercise prescription today without complaint.  Will continue to monitor for progression. ? ? ? ?Dr. Emily Filbert is Medical Director for [place].  ?Dr. Ottie Glazier is Medical Director for [facility] Pulmonary Rehabilitation. ?

## 2021-12-11 ENCOUNTER — Encounter: Payer: Medicare Other | Admitting: *Deleted

## 2021-12-11 DIAGNOSIS — Z951 Presence of aortocoronary bypass graft: Secondary | ICD-10-CM

## 2021-12-11 NOTE — Progress Notes (Signed)
Daily Session Note ? ?Patient Details  ?Name: Ernest Pennington ?MRN: [id number] ?Date of Birth: [birth year] ?Referring Provider:   ?Flowsheet Row Cardiac Rehab from 10/12/2021 in [facility] Cardiac and Pulmonary Rehab  ?Referring Provider Donnelly Angelica MD  ? ?  ? ? ?Encounter Date: 12/11/2021 ? ?Check In: ? Session Check In - 12/11/21 0835   ? ?  ? Check-In  ? Supervising physician immediately available to respond to emergencies See telemetry face sheet for immediately available ER MD   ? Location [facility]-Cardiac & Pulmonary Rehab   ? Staff Present Heath Lark, RN, BSN, CCRP;Melissa [place], RDN, Wilhelmina [place]ard[place], BS, ACSM CEP, Exercise Physiologist   ? Virtual Visit No   ? Medication changes reported     No   ? Fall or balance concerns reported    No   ? Warm-up and Cool-down Performed on first and last piece of equipment   ? Resistance Training Performed Yes   ? VAD Patient? No   ? PAD/SET Patient? No   ?  ? Pain Assessment  ? Currently in Pain? No/denies   ? ?  ?  ? ?  ? ? ? ? ? ?Social History  ? ?Tobacco Use  ?Smoking Status Former  ?Smokeless Tobacco Never  ? ? ?Goals Met:  ?Independence with exercise equipment ?Exercise tolerated well ?No report of concerns or symptoms today ? ?Goals Unmet:  ?Not Applicable ? ?Comments: Pt able to follow exercise prescription today without complaint.  Will continue to monitor for progression. ? ? ? ?Dr. Emily Filbert is Medical Director for [place].  ?Dr. Ottie Glazier is Medical Director for [facility] Pulmonary Rehabilitation. ?

## 2021-12-13 DIAGNOSIS — Z951 Presence of aortocoronary bypass graft: Secondary | ICD-10-CM | POA: Diagnosis not present

## 2021-12-13 NOTE — Progress Notes (Signed)
Daily Session Note ? ?Patient Details  ?Name: Horatio Bertz ?MRN: [id number] ?Date of Birth: 25-Jan-1956 ?Referring Provider:   ?Flowsheet Row Cardiac Rehab from 10/12/2021 in [facility] Cardiac and Pulmonary Rehab  ?Referring Provider Donnelly Angelica MD  ? ?  ? ? ?Encounter Date: 12/13/2021 ? ?Check In: ? Session Check In - 12/13/21 0758   ? ?  ? Check-In  ? Supervising physician immediately available to respond to emergencies See telemetry face sheet for immediately available ER MD   ? Location [facility]-Cardiac & Pulmonary Rehab   ? Staff Present Birdie Sons, MPA, RN;Joseph [place], RCP,RRT,BSRT;Melissa [place], RDN, LDN   ? Virtual Visit No   ? Medication changes reported     No   ? Fall or balance concerns reported    No   ? Warm-up and Cool-down Performed on first and last piece of equipment   ? Resistance Training Performed Yes   ? VAD Patient? No   ? PAD/SET Patient? No   ?  ? Pain Assessment  ? Currently in Pain? No/denies   ? ?  ?  ? ?  ? ? ? ? ? ?Social History  ? ?Tobacco Use  ?Smoking Status Former  ?Smokeless Tobacco Never  ? ? ?Goals Met:  ?Independence with exercise equipment ?Exercise tolerated well ?No report of concerns or symptoms today ?Strength training completed today ? ?Goals Unmet:  ?Not Applicable ? ?Comments: Pt able to follow exercise prescription today without complaint.  Will continue to monitor for progression. ? ? ? ?Dr. Emily Filbert is Medical Director for [place].  ?Dr. Ottie Glazier is Medical Director for [facility] Pulmonary Rehabilitation. ?

## 2021-12-14 ENCOUNTER — Other Ambulatory Visit
Admission: RE | Admit: 2021-12-14 | Discharge: 2021-12-14 | Disposition: A | Discharge status: Home / Self Care | Payer: Medicare Other | Source: Ambulatory Visit | Attending: Cardiology | Admitting: Cardiology

## 2021-12-14 DIAGNOSIS — R0602 Shortness of breath: Secondary | ICD-10-CM | POA: Insufficient documentation

## 2021-12-14 DIAGNOSIS — R058 Other specified cough: Secondary | ICD-10-CM | POA: Insufficient documentation

## 2021-12-14 DIAGNOSIS — I480 Paroxysmal atrial fibrillation: Secondary | ICD-10-CM | POA: Diagnosis present

## 2021-12-14 LAB — BRAIN NATRIURETIC PEPTIDE: B Natriuretic Peptide: 74.1 pg/mL (ref 0.0–100.0)

## 2021-12-15 ENCOUNTER — Encounter: Payer: Medicare Other | Admitting: *Deleted

## 2021-12-15 DIAGNOSIS — Z951 Presence of aortocoronary bypass graft: Secondary | ICD-10-CM

## 2021-12-15 NOTE — Progress Notes (Signed)
Daily Session Note ? ?Patient Details  ?Name: Ernest Pennington ?MRN: [id number] ?Date of Birth: January 29, 1956 ?Referring Provider:   ?Flowsheet Row Cardiac Rehab from 10/12/2021 in [facility] Cardiac and Pulmonary Rehab  ?Referring Provider Donnelly Angelica MD  ? ?  ? ? ?Encounter Date: 12/15/2021 ? ?Check In: ? Session Check In - 12/15/21 0918   ? ?  ? Check-In  ? Supervising physician immediately available to respond to emergencies See telemetry face sheet for immediately available ER MD   ? Location [facility]-Cardiac & Pulmonary Rehab   ? Staff Present Heath Lark, RN, BSN, CCRP;Laureen Owens Shark, BS, RRT, CPFT;Joseph [place], [place]   ? Virtual Visit No   ? Medication changes reported     No   ? Fall or balance concerns reported    No   ? Warm-up and Cool-down Performed on first and last piece of equipment   ? Resistance Training Performed Yes   ? VAD Patient? No   ? PAD/SET Patient? No   ?  ? Pain Assessment  ? Currently in Pain? No/denies   ? ?  ?  ? ?  ? ? ? ? ? ?Social History  ? ?Tobacco Use  ?Smoking Status Former  ?Smokeless Tobacco Never  ? ? ?Goals Met:  ?Independence with exercise equipment ?Exercise tolerated well ?No report of concerns or symptoms today ? ?Goals Unmet:  ?Not Applicable ? ?Comments: Pt able to follow exercise prescription today without complaint.  Will continue to monitor for progression. ? ? ? ?Dr. Emily Filbert is Medical Director for [place].  ?Dr. Ottie Glazier is Medical Director for [facility] Pulmonary Rehabilitation. ?

## 2021-12-18 ENCOUNTER — Encounter: Payer: Medicare Other | Admitting: *Deleted

## 2021-12-18 DIAGNOSIS — Z951 Presence of aortocoronary bypass graft: Secondary | ICD-10-CM

## 2021-12-18 NOTE — Progress Notes (Signed)
Daily Session Note ? ?Patient Details  ?Name: Gresham Caetano ?MRN: [id number] ?Date of Birth: 21-Apr-1956 ?Referring Provider:   ?Flowsheet Row Cardiac Rehab from 10/12/2021 in [facility] Cardiac and Pulmonary Rehab  ?Referring Provider Donnelly Angelica MD  ? ?  ? ? ?Encounter Date: 12/18/2021 ? ?Check In: ? Session Check In - 12/18/21 0928   ? ?  ? Check-In  ? Supervising physician immediately available to respond to emergencies See telemetry face sheet for immediately available ER MD   ? Location [facility]-Cardiac & Pulmonary Rehab   ? Staff Present Heath Lark, RN, BSN, Laveda Norman, BS, ACSM CEP, Exercise Physiologist;Joseph [place], [place]   ? Virtual Visit No   ? Medication changes reported     No   ? Fall or balance concerns reported    No   ? Warm-up and Cool-down Performed on first and last piece of equipment   ? Resistance Training Performed Yes   ? VAD Patient? No   ? PAD/SET Patient? No   ?  ? Pain Assessment  ? Currently in Pain? No/denies   ? ?  ?  ? ?  ? ? ? ? ? ?Social History  ? ?Tobacco Use  ?Smoking Status Former  ?Smokeless Tobacco Never  ? ? ?Goals Met:  ?Independence with exercise equipment ?Exercise tolerated well ?No report of concerns or symptoms today ? ?Goals Unmet:  ?Not Applicable ? ?Comments: Pt able to follow exercise prescription today without complaint.  Will continue to monitor for progression. ? ? ? ?Dr. Emily Filbert is Medical Director for [place].  ?Dr. Ottie Glazier is Medical Director for [facility] Pulmonary Rehabilitation. ?

## 2021-12-20 ENCOUNTER — Encounter: Payer: Self-pay | Admitting: *Deleted

## 2021-12-20 DIAGNOSIS — Z951 Presence of aortocoronary bypass graft: Secondary | ICD-10-CM

## 2021-12-20 NOTE — Progress Notes (Signed)
Cardiac Individual Treatment Plan ? ?Patient Details  ?Name: Ernest Pennington ?MRN: [id number] ?Date of Birth: 11/01/1955 ?Referring Provider:   ?Flowsheet Row Cardiac Rehab from 10/12/2021 in [facility] Cardiac and Pulmonary Rehab  ?Referring Provider Donnelly Angelica MD  ? ?  ? ? ?Initial Encounter Date:  ?Flowsheet Row Cardiac Rehab from 10/12/2021 in [facility] Cardiac and Pulmonary Rehab  ?Date 10/12/21  ? ?  ? ? ?Visit Diagnosis: S/P CABG x 4 ? ?Patient's Home Medications on Admission: ? ?Current Outpatient Medications:  ?  allopurinol (ZYLOPRIM) 300 MG tablet, Take 300 mg by mouth daily.   (Patient not taking: Reported on 09/27/2021), Disp: , Rfl:  ?  apixaban (ELIQUIS) 5 MG TABS tablet, Take 5 mg by mouth 2 (two) times daily., Disp: , Rfl:  ?  aspirin 81 MG tablet, Take 81 mg by mouth daily.  , Disp: , Rfl:  ?  bumetanide (BUMEX) 0.5 MG tablet, Take by mouth., Disp: , Rfl:  ?  cloNIDine (CATAPRES) 0.1 MG tablet, Take 0.1 mg by mouth 2 (two) times daily. (Patient not taking: Reported on 09/27/2021), Disp: , Rfl:  ?  diltiazem (CARDIZEM CD) 240 MG 24 hr capsule, Take 1 capsule (240 mg total) by mouth daily. (Patient not taking: Reported on 09/27/2021), Disp: 30 capsule, Rfl: 11 ?  empagliflozin (JARDIANCE) 10 MG TABS tablet, Take 1 tablet by mouth daily., Disp: , Rfl:  ?  metoprolol tartrate (LOPRESSOR) 25 MG tablet, Take by mouth., Disp: , Rfl:  ?  rosuvastatin (CRESTOR) 10 MG tablet, Take 10 mg by mouth daily., Disp: , Rfl:  ?  sitaGLIPtin (JANUVIA) 50 MG tablet, Take 1 tablet (50 mg total) by mouth daily., Disp: 90 tablet, Rfl: 3 ?  spironolactone (ALDACTONE) 50 MG tablet, Take 1 tablet (50 mg total) by mouth daily. (Patient not taking: Reported on 09/27/2021), Disp: 90 tablet, Rfl: 3 ?  traZODone (DESYREL) 50 MG tablet, Take 50 mg by mouth at bedtime as needed for sleep., Disp: , Rfl:  ? ?Past Medical History: ?Past Medical History:  ?Diagnosis Date  ? ATRIAL SEPTAL DEFECT   ? Back pain   ? CAD   ? Diverticulitis   ? History of  rectal bleeding   ? HYPERLIPIDEMIA   ? HYPERTENSION   ? Pre-diabetes   ? ? ?Tobacco Use: ?Social History  ? ?Tobacco Use  ?Smoking Status Former  ?Smokeless Tobacco Never  ? ? ?Labs: ?Review Flowsheet   ? ?  ?  Latest Ref Rng & Units 10/17/2011  ?Labs for [facility] Cardiac and Pulmonary Rehab  ?Cholestrol 0 - 200 mg/dL 116    ?LDL (calc) 0 - 100 mg/dL 67    ?HDL-C 40 - 60 mg/dL 34    ?Trlycerides 0 - 200 mg/dL 75    ?Hemoglobin A1c 4.2 - 6.3 % 7.5    ?  ?  ?  ? ? ? ?Exercise Target Goals: ?Exercise Program Goal: ?Individual exercise prescription set using results from initial 6 min walk test and THRR while considering  patient?s activity barriers and safety.  ? ?Exercise Prescription Goal: ?Initial exercise prescription builds to 30-45 minutes a day of aerobic activity, 2-3 days per week.  Home exercise guidelines will be given to patient during program as part of exercise prescription that the participant will acknowledge. ? ? ?Education: Aerobic Exercise: ?- Group verbal and visual presentation on the components of exercise prescription. Introduces F.I.T.T principle from ACSM for exercise prescriptions.  Reviews F.I.T.T. principles of aerobic exercise including progression. Written material  given at graduation. ?Flowsheet Row Cardiac Rehab from 10/12/2021 in [facility] Cardiac and Pulmonary Rehab  ?Education need identified 10/12/21  ? ?  ? ? ?Education: Resistance Exercise: ?- Group verbal and visual presentation on the components of exercise prescription. Introduces F.I.T.T principle from ACSM for exercise prescriptions  Reviews F.I.T.T. principles of resistance exercise including progression. Written material given at graduation. ? ?  ?Education: Exercise & Equipment Safety: ?- Individual verbal instruction and demonstration of equipment use and safety with use of the equipment. ?Flowsheet Row Cardiac Rehab from 10/12/2021 in [facility] Cardiac and Pulmonary Rehab  ?Education need identified 10/12/21  ?Date 10/12/21  ?Educator KL   ?Instruction Review Code 1- Verbalizes Understanding  ? ?  ? ? ?Education: Exercise Physiology & General Exercise Guidelines: ?- Group verbal and written instruction with models to review the exercise physiology of the cardiovascular system and associated critical values. Provides general exercise guidelines with specific guidelines to those with heart or lung disease.  ? ? ?Education: Flexibility, Balance, Mind/Body Relaxation: ?- Group verbal and visual presentation with interactive activity on the components of exercise prescription. Introduces F.I.T.T principle from ACSM for exercise prescriptions. Reviews F.I.T.T. principles of flexibility and balance exercise training including progression. Also discusses the mind body connection.  Reviews various relaxation techniques to help reduce and manage stress (i.e. Deep breathing, progressive muscle relaxation, and visualization). Balance handout provided to take home. Written material given at graduation. ? ? ?Activity Barriers & Risk Stratification: ? Activity Barriers & Cardiac Risk Stratification - 10/12/21 1128   ? ?  ? Activity Barriers & Cardiac Risk Stratification  ? Activity Barriers Incisional Pain;Back Problems   ? Cardiac Risk Stratification High   ? ?  ?  ? ?  ? ? ?6 Minute Walk: ? 6 Minute Walk   ? ? [place] Name 10/12/21 1407  ?  ?  ?  ? 6 Minute Walk  ? Phase Initial    ? Distance 1350 feet    ? Walk Time 6 minutes    ? # of Rest Breaks 0    ? MPH 2.55    ? METS 3.14    ? RPE 7    ? Perceived Dyspnea  1    ? VO2 Peak 11.01    ? Symptoms No    ? Resting HR 65 bpm    ? Resting BP 158/86    ? Resting Oxygen Saturation  98 %    ? Exercise Oxygen Saturation  during 6 min walk 98 %    ? Max Ex. HR 83 bpm    ? Max Ex. BP 172/88    ? 2 Minute Post BP 152/84    ? ?  ?  ? ?  ? ? ?Oxygen Initial Assessment: ? ? ?Oxygen Re-Evaluation: ? ? ?Oxygen Discharge (Final Oxygen Re-Evaluation): ? ? ?Initial Exercise Prescription: ? Initial Exercise Prescription - 10/12/21  1400   ? ?  ? Date of Initial Exercise RX and Referring Provider  ? Date 10/12/21   ? Referring Provider Donnelly Angelica MD   ?  ? Oxygen  ? Maintain Oxygen Saturation 88% or higher   ?  ? Treadmill  ? MPH 2.7   ? Grade 1   ? Minutes 15   ? METs 3.44   ?  ? NuStep  ? Level 3   ? SPM 80   ? Minutes 15   ? METs 3   ?  ? REL-XR  ? Level 2   ?  Speed 50   ? Minutes 15   ? METs 3   ?  ? T5 Nustep  ? Level 2   ? SPM 80   ? Minutes 15   ? METs 3   ?  ? Prescription Details  ? Frequency (times per week) 3   ? Duration Progress to 30 minutes of continuous aerobic without signs/symptoms of physical distress   ?  ? Intensity  ? THRR 40-80% of Max Heartrate 101- 137   ? Ratings of Perceived Exertion 11-13   ? Perceived Dyspnea 0-4   ?  ? Progression  ? Progression Continue to progress workloads to maintain intensity without signs/symptoms of physical distress.   ?  ? Resistance Training  ? Training Prescription Yes   ? Weight 4 lb   ? Reps 10-15   ? ?  ?  ? ?  ? ? ?Perform Capillary Blood Glucose checks as needed. ? ?Exercise Prescription Changes: ? ? Exercise Prescription Changes   ? ? [place] Name 10/12/21 1400 10/30/21 1200 11/22/21 0800 12/13/21 1500  ?  ?  ? Response to Exercise  ? Blood Pressure (Admit) 158/86 138/80 -- 122/74   ? Blood Pressure (Exercise) 172/88 128/64 -- --   ? Blood Pressure (Exit) 152/84 138/72 -- 124/82   ? Heart Rate (Admit) 65 bpm 57 bpm -- 59 bpm   ? Heart Rate (Exercise) 83 bpm 81 bpm -- 82 bpm   ? Heart Rate (Exit) 62 bpm 56 bpm -- 67 bpm   ? Oxygen Saturation (Admit) 98 % -- -- --   ? Oxygen Saturation (Exercise) 98 % -- -- --   ? Rating of Perceived Exertion (Exercise) 7 15 -- 16   ? Perceived Dyspnea (Exercise) 1 -- -- --   ? Symptoms none none -- none   ? Comments walk test results 2nd full week of exercise -- --   ? Duration -- Continue with 30 min of aerobic exercise without signs/symptoms of physical distress. -- Continue with 30 min of aerobic exercise without signs/symptoms of physical distress.    ? Intensity -- THRR unchanged -- THRR unchanged   ?  ? Progression  ? Progression -- Continue to progress workloads to maintain intensity without signs/symptoms of physical distress. -- Continue to prog

## 2021-12-20 NOTE — Progress Notes (Signed)
Appt cancelled

## 2021-12-22 ENCOUNTER — Emergency Department
Admission: EM | Admit: 2021-12-22 | Discharge: 2021-12-22 | Disposition: A | Discharge status: Home / Self Care | Payer: Medicare Other | Attending: Emergency Medicine | Admitting: Emergency Medicine

## 2021-12-22 ENCOUNTER — Other Ambulatory Visit: Payer: Self-pay

## 2021-12-22 ENCOUNTER — Encounter: Payer: Medicare Other | Admitting: *Deleted

## 2021-12-22 DIAGNOSIS — T783XXA Angioneurotic edema, initial encounter: Secondary | ICD-10-CM | POA: Diagnosis present

## 2021-12-22 DIAGNOSIS — Z951 Presence of aortocoronary bypass graft: Secondary | ICD-10-CM | POA: Insufficient documentation

## 2021-12-22 DIAGNOSIS — W540XXA Bitten by dog, initial encounter: Secondary | ICD-10-CM | POA: Insufficient documentation

## 2021-12-22 DIAGNOSIS — Z79899 Other long term (current) drug therapy: Secondary | ICD-10-CM | POA: Diagnosis not present

## 2021-12-22 MED ORDER — FAMOTIDINE IN NACL 20-0.9 MG/50ML-% IV SOLN
20.0000 mg | Freq: Once | INTRAVENOUS | Status: AC
  Administered 2021-12-22: 20 mg via INTRAVENOUS
  Filled 2021-12-22: qty 50

## 2021-12-22 NOTE — ED Notes (Signed)
Swallow screen obtained, pt passed, pt states it still feels "tight", pt told to not eat anything and will reevaluate.

## 2021-12-22 NOTE — ED Triage Notes (Signed)
Pt states that at 1600 he started having tongue swelling and it has increased quickly, pt reports recent bypass surg and has been on lisinopril for years. Pt took '50mg'$  of benadryl at 1730

## 2021-12-22 NOTE — Discharge Instructions (Signed)
As we discussed it is very important that you do not take any more lisinopril. Please return for any worsening swelling, or difficulty breathing. Please seek medical attention for any high fevers, chest pain, shortness of breath, change in behavior, persistent vomiting, bloody stool or any other new or concerning symptoms.

## 2021-12-22 NOTE — Progress Notes (Signed)
Daily Session Note  Patient Details  Name: Ernest Pennington MRN: [id number] Date of Birth: 07/07/1956 Referring Provider:   Flowsheet Row Cardiac Rehab from 10/12/2021 in [facility] Cardiac and Pulmonary Rehab  Referring Provider Donnelly Angelica MD       Encounter Date: 12/22/2021  Check In:  Session Check In - 12/22/21 0842       Check-In   Supervising physician immediately available to respond to emergencies See telemetry face sheet for immediately available ER MD    Location [facility]-Cardiac & Pulmonary Rehab    Staff Present Heath Lark, RN, BSN, CCRP;Joseph [place], RCP,RRT,BSRT;Jessica [place], [place], [place], CCRP, CCET    Virtual Visit No    Medication changes reported     No    Fall or balance concerns reported    No    Warm-up and Cool-down Performed on first and last piece of equipment    Resistance Training Performed Yes    VAD Patient? No    PAD/SET Patient? No      Pain Assessment   Currently in Pain? No/denies                Social History   Tobacco Use  Smoking Status Former  Smokeless Tobacco Never    Goals Met:  Independence with exercise equipment Exercise tolerated well No report of concerns or symptoms today  Goals Unmet:  Not Applicable  Comments: Pt able to follow exercise prescription today without complaint.  Will continue to monitor for progression.    Dr. Emily Filbert is Medical Director for [place].  Dr. Ottie Glazier is Medical Director for [facility] Pulmonary Rehabilitation.

## 2021-12-22 NOTE — ED Notes (Addendum)
Pt to ED for oral swelling that started a couple hours ago. Pt has obvious swelling present to Tongue and cheeks, pt has been on lisinopril for a few years, pt denies SOB and CP. Pt took 2 benadryl and ibuprofen PTA.  Pt had bypass surgery in January Pt is A&Ox4.

## 2021-12-23 NOTE — ED Provider Notes (Signed)
   [facility] Provider Note    Event Date/Time   First MD Initiated Contact with Patient 12/22/21 1819     (approximate)   History   Oral Swelling   HPI  Ernest Pennington is a 66 y.o. male  who comes to the emergency department today because of concern for tongue swelling. The patient states that he did bite his tongue earlier in the day but noticed the swelling later. Did take benadryl at home and does feel like the swelling has improved since then. The patient states that he is on lisinopril.    Physical Exam   Triage Vital Signs: ED Triage Vitals  Enc Vitals Group     BP 12/22/21 1807 (!) 190/111     Pulse Rate 12/22/21 1807 61     Resp 12/22/21 1807 18     Temp 12/22/21 1807 99.1 F (37.3 C)     Temp Source 12/22/21 1807 Oral     SpO2 12/22/21 1807 94 %     Weight --      Height --      Head Circumference --      Peak Flow --      Pain Score 12/22/21 1811 0                Most recent vital signs: Vitals:   12/22/21 2130 12/22/21 2220  BP: (!) 164/103 (!) 155/91  Pulse: (!) 49 (!) 52  Resp: 18 20  Temp:    SpO2: 95% 96%   General: Awake, alert. CV:  Good peripheral perfusion. Bradycardia. Resp:  Normal effort. Lungs clear. Abd:  No distention.  ENT:  Swelling noted to left side of tongue, no lip swelling. Able to visualize tonsils and uvula.    ED Results / Procedures / Treatments   Labs (all labs ordered are listed, but only abnormal results are displayed) Labs Reviewed - No data to display   EKG  None   RADIOLOGY None   PROCEDURES:  Critical Care performed: No  Procedures   MEDICATIONS ORDERED IN ED: Medications  famotidine (PEPCID) IVPB 20 mg premix (0 mg Intravenous Stopped 12/22/21 1903)     IMPRESSION / MDM / [place] / ED COURSE  I reviewed the triage vital signs and the nursing notes.                              Differential diagnosis includes, but is not limited to, angioedema  secondary to lisinopril, allergic reaction, traumatic edema.  Patient presents to the emergency department today because of concerns for tongue swelling.  On exam he does have swelling primarily to the left side of his tongue.  He did state that it has already improved since it was at its worst.  He was given Pepcid here in the emergency department.  He was observed for a number of hours with continued improvement.  On reevaluation the swelling does appear less.  This time I do feel is reasonable for patient be discharged home.  Did discuss strict return precautions.  Also advised patient to never take lisinopril again and to contact his cardiologist.   FINAL CLINICAL IMPRESSION(S) / ED DIAGNOSES   Final diagnoses:  Angioedema, initial encounter     Note:  This document was prepared using Dragon voice recognition software and may include unintentional dictation errors.    Ernest Pear, MD 12/23/21 [phone]

## 2021-12-27 DIAGNOSIS — Z951 Presence of aortocoronary bypass graft: Secondary | ICD-10-CM | POA: Diagnosis not present

## 2021-12-27 NOTE — Progress Notes (Signed)
Daily Session Note  Patient Details  Name: Ernest Pennington MRN: [id number] Date of Birth: 29-Nov-1955 Referring Provider:   Flowsheet Row Cardiac Rehab from 10/12/2021 in [facility] Cardiac and Pulmonary Rehab  Referring Provider Donnelly Angelica MD       Encounter Date: 12/27/2021  Check In:  Session Check In - 12/27/21 0759       Check-In   Supervising physician immediately available to respond to emergencies See telemetry face sheet for immediately available ER MD    Location [facility]-Cardiac & Pulmonary Rehab    Staff Present Birdie Sons, MPA, RN;Jessica Luan [place], MA, RCEP, CCRP, CCET;Joseph [place], [place]    Virtual Visit No    Medication changes reported     No    Fall or balance concerns reported    No    Warm-up and Cool-down Performed on first and last piece of equipment    Resistance Training Performed Yes    VAD Patient? No    PAD/SET Patient? No      Pain Assessment   Currently in Pain? No/denies                Social History   Tobacco Use  Smoking Status Former  Smokeless Tobacco Never    Goals Met:  Independence with exercise equipment Exercise tolerated well No report of concerns or symptoms today Strength training completed today  Goals Unmet:  Not Applicable  Comments: Pt able to follow exercise prescription today without complaint.  Will continue to monitor for progression.    Dr. Emily Filbert is Medical Director for [place].  Dr. Ottie Glazier is Medical Director for [facility] Pulmonary Rehabilitation.

## 2021-12-29 ENCOUNTER — Encounter: Payer: Medicare Other | Admitting: *Deleted

## 2021-12-29 DIAGNOSIS — Z951 Presence of aortocoronary bypass graft: Secondary | ICD-10-CM | POA: Diagnosis not present

## 2021-12-29 NOTE — Progress Notes (Signed)
Daily Session Note  Patient Details  Name: Isahia Hollerbach MRN: [id number] Date of Birth: [birth year] Referring Provider:   Flowsheet Row Cardiac Rehab from 10/12/2021 in [facility] Cardiac and Pulmonary Rehab  Referring Provider Donnelly Angelica MD       Encounter Date: 12/29/2021  Check In:  Session Check In - 12/29/21 0818       Check-In   Supervising physician immediately available to respond to emergencies See telemetry face sheet for immediately available ER MD    Location [facility]-Cardiac & Pulmonary Rehab    Staff Present Heath Lark, RN, BSN, CCRP;Joseph [place], RCP,RRT,BSRT;Amanda [place], [place], ACSM CEP, Exercise Physiologist    Virtual Visit No    Medication changes reported     No    Fall or balance concerns reported    No    Warm-up and Cool-down Performed on first and last piece of equipment    Resistance Training Performed Yes    VAD Patient? No    PAD/SET Patient? No      Pain Assessment   Currently in Pain? No/denies                Social History   Tobacco Use  Smoking Status Former  Smokeless Tobacco Never    Goals Met:  Independence with exercise equipment Exercise tolerated well No report of concerns or symptoms today  Goals Unmet:  Not Applicable  Comments: Pt able to follow exercise prescription today without complaint.  Will continue to monitor for progression.    Dr. Emily Filbert is Medical Director for [place].  Dr. Ottie Glazier is Medical Director for [facility] Pulmonary Rehabilitation.

## 2022-01-03 VITALS — Ht 71.5 in | Wt 234.4 lb

## 2022-01-03 DIAGNOSIS — Z951 Presence of aortocoronary bypass graft: Secondary | ICD-10-CM

## 2022-01-03 NOTE — Progress Notes (Signed)
Daily Session Note  Patient Details  Name: Ernest Pennington MRN: [id number] Date of Birth: 04-21-1956 Referring Provider:   Flowsheet Row Cardiac Rehab from 10/12/2021 in [facility] Cardiac and Pulmonary Rehab  Referring Provider Donnelly Angelica MD       Encounter Date: 01/03/2022  Check In:  Session Check In - 01/03/22 0802       Check-In   Supervising physician immediately available to respond to emergencies See telemetry face sheet for immediately available ER MD    Location [facility]-Cardiac & Pulmonary Rehab    Staff Present Birdie Sons, MPA, RN;Joseph [place], RCP,RRT,BSRT;Jessica [place], MA, RCEP, CCRP, CCET    Virtual Visit No    Medication changes reported     No    Fall or balance concerns reported    No    Warm-up and Cool-down Performed on first and last piece of equipment    Resistance Training Performed Yes    VAD Patient? No    PAD/SET Patient? No      Pain Assessment   Currently in Pain? No/denies                Social History   Tobacco Use  Smoking Status Former  Smokeless Tobacco Never    Goals Met:  Independence with exercise equipment Exercise tolerated well No report of concerns or symptoms today Strength training completed today  Goals Unmet:  Not Applicable  Comments: Pt able to follow exercise prescription today without complaint.  Will continue to monitor for progression.   [place] Name 10/12/21 1407 01/03/22 0828       6 Minute Walk   Phase Initial Discharge    Distance 1350 feet 1400 feet    Distance % Change -- 3.7 %    Distance Feet Change -- 50 ft    Walk Time 6 minutes 6 minutes    # of Rest Breaks 0 0    MPH 2.55 2.65    METS 3.14 3    RPE 7 7    Perceived Dyspnea  1 --    VO2 Peak 11.01 10.52    Symptoms No No    Resting HR 65 bpm 57 bpm    Resting BP 158/86 124/66    Resting Oxygen Saturation  98 % 96 %    Exercise Oxygen Saturation  during 6 min walk 98 % 95 %    Max Ex. HR 83 bpm 88 bpm    Max Ex.  BP 172/88 134/64    2 Minute Post BP 152/84 --              Dr. Emily Filbert is Medical Director for [place].  Dr. Ottie Glazier is Medical Director for [facility] Pulmonary Rehabilitation.

## 2022-01-04 NOTE — Patient Instructions (Signed)
Discharge Patient Instructions  Patient Details  Name: Ernest Pennington MRN: [id number] Date of Birth: 02/18/1956 Referring Provider:  Hortencia Pilar, MD   Number of Visits: 67  Reason for Discharge:  Patient reached a stable level of exercise. Patient independent in their exercise. Patient has met program and personal goals.  Smoking History:  Social History   Tobacco Use  Smoking Status Former  Smokeless Tobacco Never    Diagnosis:  S/P CABG x 4  Initial Exercise Prescription:  Initial Exercise Prescription - 10/12/21 1400       Date of Initial Exercise RX and Referring Provider   Date 10/12/21    Referring Provider Donnelly Angelica MD      Oxygen   Maintain Oxygen Saturation 88% or higher      Treadmill   MPH 2.7    Grade 1    Minutes 15    METs 3.44      NuStep   Level 3    SPM 80    Minutes 15    METs 3      REL-XR   Level 2    Speed 50    Minutes 15    METs 3      T5 Nustep   Level 2    SPM 80    Minutes 15    METs 3      Prescription Details   Frequency (times per week) 3    Duration Progress to 30 minutes of continuous aerobic without signs/symptoms of physical distress      Intensity   THRR 40-80% of Max Heartrate 101- 137    Ratings of Perceived Exertion 11-13    Perceived Dyspnea 0-4      Progression   Progression Continue to progress workloads to maintain intensity without signs/symptoms of physical distress.      Resistance Training   Training Prescription Yes    Weight 4 lb    Reps 10-15             Discharge Exercise Prescription (Final Exercise Prescription Changes):  Exercise Prescription Changes - 12/26/21 1500       Response to Exercise   Blood Pressure (Admit) 122/64    Blood Pressure (Exit) 112/62    Heart Rate (Admit) 61 bpm    Heart Rate (Exercise) 84 bpm    Heart Rate (Exit) 59 bpm    Rating of Perceived Exertion (Exercise) 12    Symptoms none    Duration Continue with 30 min of aerobic exercise  without signs/symptoms of physical distress.    Intensity THRR unchanged      Progression   Progression Continue to progress workloads to maintain intensity without signs/symptoms of physical distress.    Average METs 4.69      Resistance Training   Training Prescription Yes    Weight 5 lb    Reps 10-15      Interval Training   Interval Training No      Treadmill   MPH 2.8    Grade 3.5    Minutes 15    METs 4.69      NuStep   Level 5    Minutes 15      Home Exercise Plan   Plans to continue exercise at Home (comment)   walking, treadmill, bike, weights   Frequency Add 2 additional days to program exercise sessions.    Initial Home Exercises Provided 11/22/21      Oxygen   Maintain Oxygen Saturation 88%  or higher             Functional Capacity:  6 Minute Walk     Row Name 10/12/21 1407 01/03/22 0828       6 Minute Walk   Phase Initial Discharge    Distance 1350 feet 1400 feet    Distance % Change -- 3.7 %    Distance Feet Change -- 50 ft    Walk Time 6 minutes 6 minutes    # of Rest Breaks 0 0    MPH 2.55 2.65    METS 3.14 3    RPE 7 7    Perceived Dyspnea  1 --    VO2 Peak 11.01 10.52    Symptoms No No    Resting HR 65 bpm 57 bpm    Resting BP 158/86 124/66    Resting Oxygen Saturation  98 % 96 %    Exercise Oxygen Saturation  during 6 min walk 98 % 95 %    Max Ex. HR 83 bpm 88 bpm    Max Ex. BP 172/88 134/64    2 Minute Post BP 152/84 --             Nutrition & Weight - Outcomes:  Pre Biometrics - 10/12/21 1128       Pre Biometrics   Height 5' 11.5" (1.816 m)    Weight 235 lb 11.2 oz (106.9 kg)    BMI (Calculated) 32.42    Single Leg Stand 6.55 seconds             Post Biometrics - 01/03/22 0829        Post  Biometrics   Height 5' 11.5" (1.816 m)    Weight 234 lb 6.4 oz (106.3 kg)    BMI (Calculated) 32.24    Single Leg Stand 7.4 seconds             Nutrition:  Nutrition Therapy & Goals - 11/06/21 0812        Nutrition Therapy   RD appointment deferred Yes   Zenia Resides was not interested in scheduling an RD appointment at this time, will continue to defer.     Personal Nutrition Goals   Nutrition Goal Zenia Resides was not interested in scheduling an RD appointment at this time, will continue to defer.              Goals reviewed with patient; copy given to patient.

## 2022-01-05 ENCOUNTER — Encounter: Payer: Medicare Other | Attending: Cardiology | Admitting: *Deleted

## 2022-01-05 DIAGNOSIS — Z951 Presence of aortocoronary bypass graft: Secondary | ICD-10-CM | POA: Diagnosis present

## 2022-01-05 NOTE — Progress Notes (Signed)
Daily Session Note  Patient Details  Name: Ernest Pennington MRN: [id number] Date of Birth: [birth year] Referring Provider:   Flowsheet Row Cardiac Rehab from 10/12/2021 in [facility] Cardiac and Pulmonary Rehab  Referring Provider Donnelly Angelica MD       Encounter Date: 01/05/2022  Check In:  Session Check In - 01/05/22 0838       Check-In   Supervising physician immediately available to respond to emergencies See telemetry face sheet for immediately available ER MD    Location [facility]-Cardiac & Pulmonary Rehab    Staff Present Justin Mend, Lorre Nick, MA, RCEP, CCRP, CCET;Carra Brindley, RN, BSN, CCRP    Virtual Visit No    Medication changes reported     No    Fall or balance concerns reported    No    Warm-up and Cool-down Performed on first and last piece of equipment    Resistance Training Performed Yes    VAD Patient? No    PAD/SET Patient? No      Pain Assessment   Currently in Pain? No/denies                Social History   Tobacco Use  Smoking Status Former  Smokeless Tobacco Never    Goals Met:  Independence with exercise equipment Exercise tolerated well No report of concerns or symptoms today  Goals Unmet:  Not Applicable  Comments: Pt able to follow exercise prescription today without complaint.  Will continue to monitor for progression.    Dr. Emily Filbert is Medical Director for [facility].  Dr. Ottie Glazier is Medical Director for [facility] Pulmonary Rehabilitation.

## 2022-01-08 ENCOUNTER — Encounter: Payer: Medicare Other | Admitting: *Deleted

## 2022-01-08 DIAGNOSIS — Z951 Presence of aortocoronary bypass graft: Secondary | ICD-10-CM | POA: Diagnosis not present

## 2022-01-08 NOTE — Progress Notes (Signed)
Daily Session Note  Patient Details  Name: Ernest Pennington MRN: [id number] Date of Birth: [birth year] Referring Provider:   Flowsheet Row Cardiac Rehab from 10/12/2021 in [facility] Cardiac and Pulmonary Rehab  Referring Provider Donnelly Angelica MD       Encounter Date: 01/08/2022  Check In:  Session Check In - 01/08/22 0844       Check-In   Supervising physician immediately available to respond to emergencies See telemetry face sheet for immediately available ER MD    Location [facility]-Cardiac & Pulmonary Rehab    Staff Present Heath Lark, RN, BSN, Laveda Norman, BS, ACSM CEP, Exercise Physiologist;Joseph [place], [place]    Virtual Visit No    Medication changes reported     No    Fall or balance concerns reported    No    Warm-up and Cool-down Performed on first and last piece of equipment    Resistance Training Performed Yes    VAD Patient? No    PAD/SET Patient? No      Pain Assessment   Currently in Pain? No/denies                Social History   Tobacco Use  Smoking Status Former  Smokeless Tobacco Never    Goals Met:  Independence with exercise equipment Exercise tolerated well No report of concerns or symptoms today  Goals Unmet:  Not Applicable  Comments: Pt able to follow exercise prescription today without complaint.  Will continue to monitor for progression.    Dr. Emily Filbert is Medical Director for [place].  Dr. Ottie Glazier is Medical Director for [facility] Pulmonary Rehabilitation.

## 2022-01-10 DIAGNOSIS — Z951 Presence of aortocoronary bypass graft: Secondary | ICD-10-CM

## 2022-01-10 NOTE — Progress Notes (Signed)
Daily Session Note  Patient Details  Name: Ernest Pennington MRN: [id number] Date of Birth: [birth year] Referring Provider:   Flowsheet Row Cardiac Rehab from 10/12/2021 in [facility] Cardiac and Pulmonary Rehab  Referring Provider Donnelly Angelica MD       Encounter Date: 01/10/2022  Check In:  Session Check In - 01/10/22 0752       Check-In   Supervising physician immediately available to respond to emergencies See telemetry face sheet for immediately available ER MD    Location [facility]-Cardiac & Pulmonary Rehab    Staff Present Carson Myrtle, BS, RRT, CPFT;Joseph Karie Fetch, MPA, RN    Virtual Visit No    Medication changes reported     No    Fall or balance concerns reported    No    Warm-up and Cool-down Performed on first and last piece of equipment    Resistance Training Performed Yes    VAD Patient? No    PAD/SET Patient? No      Pain Assessment   Currently in Pain? No/denies                Social History   Tobacco Use  Smoking Status Former  Smokeless Tobacco Never    Goals Met:  Independence with exercise equipment Exercise tolerated well No report of concerns or symptoms today Strength training completed today  Goals Unmet:  Not Applicable  Comments: Pt able to follow exercise prescription today without complaint.  Will continue to monitor for progression.    Dr. Emily Filbert is Medical Director for [facility].  Dr. Ottie Glazier is Medical Director for [facility] Pulmonary Rehabilitation.

## 2022-01-12 ENCOUNTER — Encounter: Payer: Medicare Other | Admitting: *Deleted

## 2022-01-12 DIAGNOSIS — Z951 Presence of aortocoronary bypass graft: Secondary | ICD-10-CM

## 2022-01-12 NOTE — Progress Notes (Signed)
Daily Session Note  Patient Details  Name: Ernest Pennington MRN: [id number] Date of Birth: 05/29/1956 Referring Provider:   Flowsheet Row Cardiac Rehab from 10/12/2021 in [facility] Cardiac and Pulmonary Rehab  Referring Provider Orgel, Ryan MD       Encounter Date: 01/12/2022  Check In:  Session Check In - 01/12/22 0927       Check-In   Supervising physician immediately available to respond to emergencies See telemetry face sheet for immediately available ER MD    Location [facility]-Cardiac & Pulmonary Rehab    Staff Present Joseph Hood, RCP,RRT,BSRT;Jessica Hawkins, MA, RCEP, CCRP, CCET;Susanne Bice, RN, BSN, CCRP    Virtual Visit No    Medication changes reported     No    Fall or balance concerns reported    No    Warm-up and Cool-down Performed on first and last piece of equipment    Resistance Training Performed Yes    VAD Patient? No    PAD/SET Patient? No      Pain Assessment   Currently in Pain? No/denies                Social History   Tobacco Use  Smoking Status Former  Smokeless Tobacco Never    Goals Met:  Independence with exercise equipment Exercise tolerated well Personal goals reviewed No report of concerns or symptoms today  Goals Unmet:  Not Applicable  Comments:  Ernest Pennington graduated today from  rehab with 31 sessions completed.  Details of the patient's exercise prescription and what He needs to do in order to continue the prescription and progress were discussed with patient.  Patient was given a copy of prescription and goals.  Patient verbalized understanding.  Ernest Pennington plans to continue to exercise by walking.    Dr. Mark Miller is Medical Director for [facility].  Dr. Fuad Aleskerov is Medical Director for [facility] Pulmonary Rehabilitation. 

## 2022-01-12 NOTE — Progress Notes (Signed)
Cardiac Individual Treatment Plan  Patient Details  Name: Ernest Pennington MRN: [id number] Date of Birth: [birth year] Referring Provider:   Flowsheet Row Cardiac Rehab from 10/12/2021 in [facility] Cardiac and Pulmonary Rehab  Referring Provider Donnelly Angelica MD       Initial Encounter Date:  Flowsheet Row Cardiac Rehab from 10/12/2021 in [facility] Cardiac and Pulmonary Rehab  Date 10/12/21       Visit Diagnosis: S/P CABG x 4  Patient's Home Medications on Admission:  Current Outpatient Medications:    allopurinol (ZYLOPRIM) 300 MG tablet, Take 300 mg by mouth daily.   (Patient not taking: Reported on 09/27/2021), Disp: , Rfl:    apixaban (ELIQUIS) 5 MG TABS tablet, Take 5 mg by mouth 2 (two) times daily., Disp: , Rfl:    aspirin 81 MG tablet, Take 81 mg by mouth daily.  , Disp: , Rfl:    bumetanide (BUMEX) 0.5 MG tablet, Take by mouth., Disp: , Rfl:    cloNIDine (CATAPRES) 0.1 MG tablet, Take 0.1 mg by mouth 2 (two) times daily. (Patient not taking: Reported on 09/27/2021), Disp: , Rfl:    diltiazem (CARDIZEM CD) 240 MG 24 hr capsule, Take 1 capsule (240 mg total) by mouth daily. (Patient not taking: Reported on 09/27/2021), Disp: 30 capsule, Rfl: 11   empagliflozin (JARDIANCE) 10 MG TABS tablet, Take 1 tablet by mouth daily., Disp: , Rfl:    metoprolol tartrate (LOPRESSOR) 25 MG tablet, Take by mouth., Disp: , Rfl:    rosuvastatin (CRESTOR) 10 MG tablet, Take 10 mg by mouth daily., Disp: , Rfl:    sitaGLIPtin (JANUVIA) 50 MG tablet, Take 1 tablet (50 mg total) by mouth daily., Disp: 90 tablet, Rfl: 3   spironolactone (ALDACTONE) 50 MG tablet, Take 1 tablet (50 mg total) by mouth daily. (Patient not taking: Reported on 09/27/2021), Disp: 90 tablet, Rfl: 3   traZODone (DESYREL) 50 MG tablet, Take 50 mg by mouth at bedtime as needed for sleep., Disp: , Rfl:   Past Medical History: Past Medical History:  Diagnosis Date   ATRIAL SEPTAL DEFECT    Back pain    CAD    Diverticulitis    History of  rectal bleeding    HYPERLIPIDEMIA    HYPERTENSION    Pre-diabetes     Tobacco Use: Social History   Tobacco Use  Smoking Status Former  Smokeless Tobacco Never    Labs: Review Flowsheet       Latest Ref Rng & Units 10/17/2011  Labs for ITP Cardiac and Pulmonary Rehab  Cholestrol 0 - 200 mg/dL 116   LDL (calc) 0 - 100 mg/dL 67   HDL-C 40 - 60 mg/dL 34   Trlycerides 0 - 200 mg/dL 75   Hemoglobin A1c 4.2 - 6.3 % 7.5      Exercise Target Goals: Exercise Program Goal: Individual exercise prescription set using results from initial 6 min walk test and THRR while considering  patient's activity barriers and safety.   Exercise Prescription Goal: Initial exercise prescription builds to 30-45 minutes a day of aerobic activity, 2-3 days per week.  Home exercise guidelines will be given to patient during program as part of exercise prescription that the participant will acknowledge.   Education: Aerobic Exercise: - Group verbal and visual presentation on the components of exercise prescription. Introduces F.I.T.T principle from ACSM for exercise prescriptions.  Reviews F.I.T.T. principles of aerobic exercise including progression. Written material given at graduation. Flowsheet Row Cardiac Rehab from 10/12/2021 in [facility] Cardiac  and Pulmonary Rehab  Education need identified 10/12/21       Education: Resistance Exercise: - Group verbal and visual presentation on the components of exercise prescription. Introduces F.I.T.T principle from ACSM for exercise prescriptions  Reviews F.I.T.T. principles of resistance exercise including progression. Written material given at graduation.    Education: Exercise & Equipment Safety: - Individual verbal instruction and demonstration of equipment use and safety with use of the equipment. Flowsheet Row Cardiac Rehab from 10/12/2021 in [facility] Cardiac and Pulmonary Rehab  Education need identified 10/12/21  Date 10/12/21  Educator Tolley  Instruction  Review Code 1- Verbalizes Understanding       Education: Exercise Physiology & General Exercise Guidelines: - Group verbal and written instruction with models to review the exercise physiology of the cardiovascular system and associated critical values. Provides general exercise guidelines with specific guidelines to those with heart or lung disease.    Education: Flexibility, Balance, Mind/Body Relaxation: - Group verbal and visual presentation with interactive activity on the components of exercise prescription. Introduces F.I.T.T principle from ACSM for exercise prescriptions. Reviews F.I.T.T. principles of flexibility and balance exercise training including progression. Also discusses the mind body connection.  Reviews various relaxation techniques to help reduce and manage stress (i.e. Deep breathing, progressive muscle relaxation, and visualization). Balance handout provided to take home. Written material given at graduation.   Activity Barriers & Risk Stratification:  Activity Barriers & Cardiac Risk Stratification - 10/12/21 1128       Activity Barriers & Cardiac Risk Stratification   Activity Barriers Incisional Pain;Back Problems    Cardiac Risk Stratification High             6 Minute Walk:  6 Minute Walk     Row Name 10/12/21 1407 01/03/22 0828       6 Minute Walk   Phase Initial Discharge    Distance 1350 feet 1400 feet    Distance % Change -- 3.7 %    Distance Feet Change -- 50 ft    Walk Time 6 minutes 6 minutes    # of Rest Breaks 0 0    MPH 2.55 2.65    METS 3.14 3    RPE 7 7    Perceived Dyspnea  1 --    VO2 Peak 11.01 10.52    Symptoms No No    Resting HR 65 bpm 57 bpm    Resting BP 158/86 124/66    Resting Oxygen Saturation  98 % 96 %    Exercise Oxygen Saturation  during 6 min walk 98 % 95 %    Max Ex. HR 83 bpm 88 bpm    Max Ex. BP 172/88 134/64    2 Minute Post BP 152/84 --             Oxygen Initial Assessment:   Oxygen  Re-Evaluation:   Oxygen Discharge (Final Oxygen Re-Evaluation):   Initial Exercise Prescription:  Initial Exercise Prescription - 10/12/21 1400       Date of Initial Exercise RX and Referring Provider   Date 10/12/21    Referring Provider Donnelly Angelica MD      Oxygen   Maintain Oxygen Saturation 88% or higher      Treadmill   MPH 2.7    Grade 1    Minutes 15    METs 3.44      NuStep   Level 3    SPM 80    Minutes 15    METs 3  REL-XR   Level 2    Speed 50    Minutes 15    METs 3      T5 Nustep   Level 2    SPM 80    Minutes 15    METs 3      Prescription Details   Frequency (times per week) 3    Duration Progress to 30 minutes of continuous aerobic without signs/symptoms of physical distress      Intensity   THRR 40-80% of Max Heartrate 101- 137    Ratings of Perceived Exertion 11-13    Perceived Dyspnea 0-4      Progression   Progression Continue to progress workloads to maintain intensity without signs/symptoms of physical distress.      Resistance Training   Training Prescription Yes    Weight 4 lb    Reps 10-15             Perform Capillary Blood Glucose checks as needed.  Exercise Prescription Changes:   Exercise Prescription Changes     Row Name 10/12/21 1400 10/30/21 1200 11/22/21 0800 12/13/21 1500 12/26/21 1500     Response to Exercise   Blood Pressure (Admit) 158/86 138/80 -- 122/74 122/64   Blood Pressure (Exercise) 172/88 128/64 -- -- --   Blood Pressure (Exit) 152/84 138/72 -- 124/82 112/62   Heart Rate (Admit) 65 bpm 57 bpm -- 59 bpm 61 bpm   Heart Rate (Exercise) 83 bpm 81 bpm -- 82 bpm 84 bpm   Heart Rate (Exit) 62 bpm 56 bpm -- 67 bpm 59 bpm   Oxygen Saturation (Admit) 98 % -- -- -- --   Oxygen Saturation (Exercise) 98 % -- -- -- --   Rating of Perceived Exertion (Exercise) 7 15 -- 16 12   Perceived Dyspnea (Exercise) 1 -- -- -- --   Symptoms none none -- none none   Comments walk test results 2nd full week of  exercise -- -- --   Duration -- Continue with 30 min of aerobic exercise without signs/symptoms of physical distress. -- Continue with 30 min of aerobic exercise without signs/symptoms of physical distress. Continue with 30 min of aerobic exercise without signs/symptoms of physical distress.   Intensity -- THRR unchanged -- THRR unchanged THRR unchanged     Progression   Progression -- Continue to progress workloads to maintain intensity without signs/symptoms of physical distress. -- Continue to progress workloads to maintain intensity without signs/symptoms of physical distress. Continue to progress workloads to maintain intensity without signs/symptoms of physical distress.   Average METs -- 3.22 -- 4.31 4.69     Resistance Training   Training Prescription -- Yes -- Yes Yes   Weight -- 4 lb -- 5 lb 5 lb   Reps -- 10-15 -- 10-15 10-15     Interval Training   Interval Training -- No -- No No     Treadmill   MPH -- 2.4 -- 3 2.8   Grade -- 2 -- 4 3.5   Minutes -- 15 -- 15 15   METs -- 3.5 -- 4.95 4.69     NuStep   Level -- 4 -- 5 5   Minutes -- 15 -- 15 15   METs -- -- -- 5.8 --     REL-XR   Level -- 2 -- 2.2 --   Minutes -- 15 -- 15 --   METs -- -- -- 2.3 --     T5 Nustep   Level --  3 -- 3 --   Minutes -- 15 -- 15 --   METs -- 2.8 -- -- --     Track   Laps -- -- -- 40 --   Minutes -- -- -- 15 --   METs -- -- -- 3.18 --     Home Exercise Plan   Plans to continue exercise at -- -- Home (comment)  walking, treadmill, bike, weights Home (comment)  walking, treadmill, bike, weights Home (comment)  walking, treadmill, bike, weights   Frequency -- -- Add 2 additional days to program exercise sessions. Add 2 additional days to program exercise sessions. Add 2 additional days to program exercise sessions.   Initial Home Exercises Provided -- -- 11/22/21 11/22/21 11/22/21     Oxygen   Maintain Oxygen Saturation -- 88% or higher -- 88% or higher 88% or higher    Row Name  01/09/22 1500             Response to Exercise   Blood Pressure (Admit) 124/68       Blood Pressure (Exit) 122/62       Heart Rate (Admit) 63 bpm       Heart Rate (Exercise) 88 bpm       Heart Rate (Exit) 58 bpm       Rating of Perceived Exertion (Exercise) 13       Symptoms none       Duration Continue with 30 min of aerobic exercise without signs/symptoms of physical distress.       Intensity THRR unchanged         Progression   Progression Continue to progress workloads to maintain intensity without signs/symptoms of physical distress.       Average METs 3.77         Resistance Training   Training Prescription Yes       Weight 5 lb       Reps 10-15         Interval Training   Interval Training No         Treadmill   MPH 2.7       Grade 4       Minutes 15       METs 4.56         NuStep   Level 4       Minutes 15       METs 4.2         Recumbant Elliptical   Level 4       Minutes 15       METs 2.4         Home Exercise Plan   Plans to continue exercise at Home (comment)  walking, treadmill, bike, weights       Frequency Add 2 additional days to program exercise sessions.       Initial Home Exercises Provided 11/22/21         Oxygen   Maintain Oxygen Saturation 88% or higher                Exercise Comments:   Exercise Comments     Row Name 10/16/21 3790           Exercise Comments First full day of exercise!  Patient was oriented to gym and equipment including functions, settings, policies, and procedures.  Patient's individual exercise prescription and treatment plan were reviewed.  All starting workloads were established based on the results of the 6 minute walk test done at initial  orientation visit.  The plan for exercise progression was also introduced and progression will be customized based on patient's performance and goals.                Exercise Goals and Review:   Exercise Goals     Row Name 10/12/21 1412              Exercise Goals   Increase Physical Activity Yes       Intervention Provide advice, education, support and counseling about physical activity/exercise needs.;Develop an individualized exercise prescription for aerobic and resistive training based on initial evaluation findings, risk stratification, comorbidities and participant's personal goals.       Expected Outcomes Short Term: Attend rehab on a regular basis to increase amount of physical activity.;Long Term: Add in home exercise to make exercise part of routine and to increase amount of physical activity.;Long Term: Exercising regularly at least 3-5 days a week.       Increase Strength and Stamina Yes       Intervention Provide advice, education, support and counseling about physical activity/exercise needs.;Develop an individualized exercise prescription for aerobic and resistive training based on initial evaluation findings, risk stratification, comorbidities and participant's personal goals.       Expected Outcomes Short Term: Increase workloads from initial exercise prescription for resistance, speed, and METs.;Short Term: Perform resistance training exercises routinely during rehab and add in resistance training at home;Long Term: Improve cardiorespiratory fitness, muscular endurance and strength as measured by increased METs and functional capacity (6MWT)       Able to understand and use rate of perceived exertion (RPE) scale Yes       Intervention Provide education and explanation on how to use RPE scale       Expected Outcomes Short Term: Able to use RPE daily in rehab to express subjective intensity level;Long Term:  Able to use RPE to guide intensity level when exercising independently       Able to understand and use Dyspnea scale Yes       Intervention Provide education and explanation on how to use Dyspnea scale       Expected Outcomes Short Term: Able to use Dyspnea scale daily in rehab to express subjective sense of shortness of breath  during exertion;Long Term: Able to use Dyspnea scale to guide intensity level when exercising independently       Knowledge and understanding of Target Heart Rate Range (THRR) Yes       Intervention Provide education and explanation of THRR including how the numbers were predicted and where they are located for reference       Expected Outcomes Short Term: Able to state/look up THRR;Long Term: Able to use THRR to govern intensity when exercising independently;Short Term: Able to use daily as guideline for intensity in rehab       Able to check pulse independently Yes       Intervention Provide education and demonstration on how to check pulse in carotid and radial arteries.;Review the importance of being able to check your own pulse for safety during independent exercise       Expected Outcomes Long Term: Able to check pulse independently and accurately;Short Term: Able to explain why pulse checking is important during independent exercise       Understanding of Exercise Prescription Yes       Intervention Provide education, explanation, and written materials on patient's individual exercise prescription       Expected Outcomes Short  Term: Able to explain program exercise prescription;Long Term: Able to explain home exercise prescription to exercise independently                Exercise Goals Re-Evaluation :  Exercise Goals Re-Evaluation     Row Name 10/16/21 0928 10/30/21 1225 11/14/21 1312 11/22/21 0808 12/13/21 0754     Exercise Goal Re-Evaluation   Exercise Goals Review Knowledge and understanding of Target Heart Rate Range (THRR);Able to understand and use rate of perceived exertion (RPE) scale;Able to understand and use Dyspnea scale;Understanding of Exercise Prescription Increase Physical Activity;Increase Strength and Stamina Increase Physical Activity;Increase Strength and Stamina Increase Physical Activity;Increase Strength and Stamina;Able to understand and use rate of perceived  exertion (RPE) scale;Able to understand and use Dyspnea scale;Able to check pulse independently;Knowledge and understanding of Target Heart Rate Range (THRR);Understanding of Exercise Prescription Increase Physical Activity;Increase Strength and Stamina;Understanding of Exercise Prescription   Comments Reviewed RPE and dyspnea scales, THR and program prescription with pt today.  Pt voiced understanding and was given a copy of goals to take home.       Short: Use RPE daily to regulate intensity. Long: Follow program prescription in THR. Ernest Pennington is doing well for the first couple of sessions that he has been here.  He is already up to level 4 on the T4 and level 3 on the T5, averaging around 3.5 METS on both machines. He is tolerating the walking on the treadmill well. Will continue to monitor. Ernest Pennington attends consistently and works at [employer].  he has increased to 2.6 mph and 3 % incline on TM.  We will continue to monitor. Reviewed home exercise with pt today.  Pt plans to walk, treadmill, bike, and weights at home for exercise.  Reviewed THR, pulse, RPE, sign and symptoms, pulse oximetery and when to call 911 or MD.  Also discussed weather considerations and indoor options.  Pt voiced understanding. Ernest Pennington is doing well in rehab.  He is has been staying active working in yard with mowing and weed eating.  He enjoys working in yard.  He does feel like his strength and stamina are getting better.  He continues to work on his core muscles that he loss from his stroke.   Expected Outcomes Short: Use RPE daily to regulate intensity. Long: Follow program prescription in THR. Short: Increase loads on treadmill Long: Continue to increase overall MET level Short: maintain consistent attendance Long: continue to build stamina Short: Start to add in consistent exercise at home Long; continue to exercise independently Short: Continue to stay active with exercise Long: continue to improve stamina    Row Name 12/13/21 1559  12/26/21 1552 01/05/22 0823 01/09/22 1526       Exercise Goal Re-Evaluation   Exercise Goals Review Increase Physical Activity;Increase Strength and Stamina;Understanding of Exercise Prescription Increase Physical Activity;Increase Strength and Stamina;Understanding of Exercise Prescription Increase Physical Activity;Increase Strength and Stamina;Understanding of Exercise Prescription Increase Physical Activity;Increase Strength and Stamina;Understanding of Exercise Prescription    Comments Ernest Pennington continued to do well in rehab. He has increased to 5 lbs for his handweights. He was able to work up to 4.95 METS on the treadmill initiated from an increase in both speed and incline. He also tried out the track for the first time and was able to walk 40 laps. We will continue to monitor. Ernest Pennington was out this week with an allergic reaction to his BP meds. He is up to 3.5% grade on the treadmill.  Once he  is able to return, we will continue to monitor his progress. [name] improved his walk test by 50 ft.  He is planning to continue to walk at home and use his treadmill.  He is also planning to build a house by himself for some strength training.  He is looking forward to the challenge. Ernest Pennington is doing well in rehab and is ready to graduate. He completed his post 6 minute walk test and increased his distance by 50 feet. He also began to use the REL machine and tolerated level 4. We will continue to monitor his progress until he graduates.    Expected Outcomes Short: Increase level on T5 Nustep Long: Continue to increase overall MET level Short: Continue to try intervals Long: COnitnue to improve stamina. Graduate and continue to exercise independently Short: Graduate. Long: Continue to exercise independently.             Discharge Exercise Prescription (Final Exercise Prescription Changes):  Exercise Prescription Changes - 01/09/22 1500       Response to Exercise   Blood Pressure (Admit) 124/68    Blood  Pressure (Exit) 122/62    Heart Rate (Admit) 63 bpm    Heart Rate (Exercise) 88 bpm    Heart Rate (Exit) 58 bpm    Rating of Perceived Exertion (Exercise) 13    Symptoms none    Duration Continue with 30 min of aerobic exercise without signs/symptoms of physical distress.    Intensity THRR unchanged      Progression   Progression Continue to progress workloads to maintain intensity without signs/symptoms of physical distress.    Average METs 3.77      Resistance Training   Training Prescription Yes    Weight 5 lb    Reps 10-15      Interval Training   Interval Training No      Treadmill   MPH 2.7    Grade 4    Minutes 15    METs 4.56      NuStep   Level 4    Minutes 15    METs 4.2      Recumbant Elliptical   Level 4    Minutes 15    METs 2.4      Home Exercise Plan   Plans to continue exercise at Home (comment)   walking, treadmill, bike, weights   Frequency Add 2 additional days to program exercise sessions.    Initial Home Exercises Provided 11/22/21      Oxygen   Maintain Oxygen Saturation 88% or higher             Nutrition:  Target Goals: Understanding of nutrition guidelines, daily intake of sodium <1540m, cholesterol <2029m calories 30% from fat and 7% or less from saturated fats, daily to have 5 or more servings of fruits and vegetables.  Education: All About Nutrition: -Group instruction provided by verbal, written material, interactive activities, discussions, models, and posters to present general guidelines for heart healthy nutrition including fat, fiber, MyPlate, the role of sodium in heart healthy nutrition, utilization of the nutrition label, and utilization of this knowledge for meal planning. Follow up email sent as well. Written material given at graduation.   Biometrics:  Pre Biometrics - 10/12/21 1128       Pre Biometrics   Height 5' 11.5" (1.816 m)    Weight 235 lb 11.2 oz (106.9 kg)    BMI (Calculated) 32.42    Single Leg  Stand 6.55 seconds  Post Biometrics - 01/03/22 2585        Post  Biometrics   Height 5' 11.5" (1.816 m)    Weight 234 lb 6.4 oz (106.3 kg)    BMI (Calculated) 32.24    Single Leg Stand 7.4 seconds             Nutrition Therapy Plan and Nutrition Goals:  Nutrition Therapy & Goals - 11/06/21 0812       Nutrition Therapy   RD appointment deferred Yes   Ernest Pennington was not interested in scheduling an RD appointment at this time, will continue to defer.     Personal Nutrition Goals   Nutrition Goal Ernest Pennington was not interested in scheduling an RD appointment at this time, will continue to defer.             Nutrition Assessments:  MEDIFICTS Score Key: ?70 Need to make dietary changes  40-70 Heart Healthy Diet ? 40 Therapeutic Level Cholesterol Diet  Flowsheet Row Cardiac Rehab from 01/08/2022 in [facility] Cardiac and Pulmonary Rehab  Picture Your Plate Total Score on Admission 67  Picture Your Plate Total Score on Discharge 55      Picture Your Plate Scores: <27 Unhealthy dietary pattern with much room for improvement. 41-50 Dietary pattern unlikely to meet recommendations for good health and room for improvement. 51-60 More healthful dietary pattern, with some room for improvement.  >60 Healthy dietary pattern, although there may be some specific behaviors that could be improved.    Nutrition Goals Re-Evaluation:  Nutrition Goals Re-Evaluation     Row Name 10/23/21 7824 12/13/21 0800 01/05/22 2353         Goals   Nutrition Goal Ernest Pennington would still like to talk about nutrition, but would like to defer for about a month; cancelled todays' appointment. Ernest Pennington was not interested in scheduling an RD appointment at this time, will continue to defer. Ernest Pennington was not interested in scheduling an RD appointment at this time, will continue to defer.     Comment -- Ernest Pennington is doing well with his diet.  He stays away from fried food and tries to eat a lot of vegetables.  He is  trying really hard to make good choices. Ernest Pennington has dones well in rehab. He made some changes with his diet and picking better options.     Expected Outcome -- Short: Continue to focus on healthy eating Long: Conitnue to follow heart healthy diet Continue to follow heart healthy diet              Nutrition Goals Discharge (Final Nutrition Goals Re-Evaluation):  Nutrition Goals Re-Evaluation - 01/05/22 0826       Goals   Nutrition Goal Ernest Pennington was not interested in scheduling an RD appointment at this time, will continue to defer.    Comment Ernest Pennington has dones well in rehab. He made some changes with his diet and picking better options.    Expected Outcome Continue to follow heart healthy diet             Psychosocial: Target Goals: Acknowledge presence or absence of significant depression and/or stress, maximize coping skills, provide positive support system. Participant is able to verbalize types and ability to use techniques and skills needed for reducing stress and depression.   Education: Stress, Anxiety, and Depression - Group verbal and visual presentation to define topics covered.  Reviews how body is impacted by stress, anxiety, and depression.  Also discusses healthy ways to reduce stress and to treat/manage  anxiety and depression.  Written material given at graduation.   Education: Sleep Hygiene -Provides group verbal and written instruction about how sleep can affect your health.  Define sleep hygiene, discuss sleep cycles and impact of sleep habits. Review good sleep hygiene tips.    Initial Review & Psychosocial Screening:  Initial Psych Review & Screening - 09/27/21 1415       Initial Review   Current issues with Current Stress Concerns    Source of Stress Concerns Occupation      [place]? Yes   wife, family     Barriers   Psychosocial barriers to participate in program There are no identifiable barriers or psychosocial needs.;The  patient should benefit from training in stress management and relaxation.      Screening Interventions   Interventions Encouraged to exercise;Provide feedback about the scores to participant    Expected Outcomes Short Term goal: Utilizing psychosocial counselor, staff and physician to assist with identification of specific Stressors or current issues interfering with healing process. Setting desired goal for each stressor or current issue identified.;Long Term Goal: Stressors or current issues are controlled or eliminated.;Short Term goal: Identification and review with participant of any Quality of Life or Depression concerns found by scoring the questionnaire.;Long Term goal: The participant improves quality of Life and PHQ9 Scores as seen by post scores and/or verbalization of changes             Quality of Life Scores:   Quality of Life - 01/08/22 0831       Quality of Life   Select Quality of Life      Quality of Life Scores   Health/Function Pre 23.54 %    Health/Function Post 20 %    Health/Function % Change -15.04 %    Socioeconomic Pre 23.25 %    Socioeconomic Post 21.67 %    Socioeconomic % Change  -6.8 %    Psych/Spiritual Pre 26 %    Psych/Spiritual Post 22 %    Psych/Spiritual % Change -15.38 %    Family Pre 27.6 %    Family Post 26.2 %    Family % Change -5.07 %    GLOBAL Pre 24.57 %    GLOBAL Post 27.71 %    GLOBAL % Change 12.78 %            Scores of 19 and below usually indicate a poorer quality of life in these areas.  A difference of  2-3 points is a clinically meaningful difference.  A difference of 2-3 points in the total score of the Quality of Life Index has been associated with significant improvement in overall quality of life, self-image, physical symptoms, and general health in studies assessing change in quality of life.  PHQ-9: Review Flowsheet       01/08/2022 12/06/2021 11/08/2021 10/12/2021  Depression screen PHQ 2/9  Decreased Interest 0 3 0  0  Down, Depressed, Hopeless 1 0 0 0  PHQ - 2 Score 1 3 0 0  Altered sleeping 0 0 0 3  Tired, decreased energy 3 3 3 1   Change in appetite 2 3 0 2  Feeling bad or failure about yourself  2 2 3 2   Trouble concentrating 2 2 3 3   Moving slowly or fidgety/restless 0 0 3 3  Suicidal thoughts 0 0 0 0  PHQ-9 Score 10 13 12 14   Difficult doing work/chores Somewhat difficult Not difficult at all Somewhat difficult Somewhat  difficult   Interpretation of Total Score  Total Score Depression Severity:  1-4 = Minimal depression, 5-9 = Mild depression, 10-14 = Moderate depression, 15-19 = Moderately severe depression, 20-27 = Severe depression   Psychosocial Evaluation and Intervention:  Psychosocial Evaluation - 09/27/21 1416       Psychosocial Evaluation & Interventions   Interventions Relaxation education;Encouraged to exercise with the program and follow exercise prescription    Comments Ernest Pennington reports 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 He is already back to work part time at his company. 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 He states he has a great support system of his wife and family. He is very active and wants to get back to his usual routine while learning more about how to live a heart healthy lifestyle. He knows his genetics play a big role in his medical history and wants to work on the things he can change.    Expected Outcomes Short: attend cardiac rehab for education and exercise. Long: develop and maintain positive self care habits.    Continue Psychosocial Services  Follow up required by staff             Psychosocial Re-Evaluation:  Psychosocial Re-Evaluation     Moore Name 11/08/21 [id number] 12/13/21 0757 01/05/22 0824         Psychosocial Re-Evaluation   Current issues with Current Depression;Current Stress Concerns Current Depression;Current Stress Concerns Current Depression;Current Stress Concerns      Comments Reviewed patient health questionnaire (PHQ-9) with patient for follow up. Previously, patients score indicated signs/symptoms of depression.  Reviewed to see if patient is improving symptom wise while in program.  Score improved and patient states that it is because they have been getting out more and feeling better. Ernest Pennington is doing well in rehab. He is feeling pretty good mentally for the most part.  He has days some blue days, but tries to stay good overall.  His PHQ is still around 13.  He is managing the best he can and feels good with his medication regimine.  He enjoys working and it keeps him active and stress free Ernest Pennington has done well in rehab.  He is excited about graduating and has plans to challenge himself by build a house on his own starting this fall.  He has enjoyed getting back into shape and a routine of exercise. He has also enjoyed meeting people again.     Expected Outcomes Short; COntinue to attend rehab for mental boost  Long: Continue to focus on positive Short; Continue ot exercise for mental boost.  Long: conitnue to stay positive Continue to exercise for mental boost and stay challenged     Interventions Encouraged to attend Cardiac Rehabilitation for the exercise Encouraged to attend Cardiac Rehabilitation for the exercise Encouraged to attend Cardiac Rehabilitation for the exercise     Continue Psychosocial Services  Follow up required by staff -- --              Psychosocial Discharge (Final Psychosocial Re-Evaluation):  Psychosocial Re-Evaluation - 01/05/22 0824       Psychosocial Re-Evaluation   Current issues with Current Depression;Current Stress Concerns    Comments Ernest Pennington has done well in rehab.  He is excited about graduating and has plans to challenge himself by build a house on his own starting this fall.  He has enjoyed getting back into shape and a routine  of exercise. He has also enjoyed meeting people again.    Expected Outcomes Continue to exercise  for mental boost and stay challenged    Interventions Encouraged to attend Cardiac Rehabilitation for the exercise             Vocational Rehabilitation: Provide vocational rehab assistance to qualifying candidates.   Vocational Rehab Evaluation & Intervention:  Vocational Rehab - 01/08/22 6222       Initial Vocational Rehab Evaluation & Intervention   Assessment shows need for Vocational Rehabilitation No      Discharge Vocational Rehab   Discharge Vocational Rehabilitation discharge   no need for VR             Education: Education Goals: Education classes will be provided on a variety of topics geared toward better understanding of heart health and risk factor modification. Participant will state understanding/return demonstration of topics presented as noted by education test scores.  Learning Barriers/Preferences:  Learning Barriers/Preferences - 09/27/21 1413       Learning Barriers/Preferences   Learning Barriers None    Learning Preferences None             General Cardiac Education Topics:  AED/CPR: - Group verbal and written instruction with the use of models to demonstrate the basic use of the AED with the basic ABC's of resuscitation.   Anatomy and Cardiac Procedures: - Group verbal and visual presentation and models provide information about basic cardiac anatomy and function. Reviews the testing methods done to diagnose heart disease and the outcomes of the test results. Describes the treatment choices: Medical Management, Angioplasty, or Coronary Bypass Surgery for treating various heart conditions including Myocardial Infarction, Angina, Valve Disease, and Cardiac Arrhythmias.  Written material given at graduation.   Medication Safety: - Group verbal and visual instruction to review commonly prescribed medications for heart and lung disease. Reviews the medication, class of the drug, and side effects. Includes the steps to properly store meds  and maintain the prescription regimen.  Written material given at graduation.   Intimacy: - Group verbal instruction through game format to discuss how heart and lung disease can affect sexual intimacy. Written material given at graduation..   Know Your Numbers and Heart Failure: - Group verbal and visual instruction to discuss disease risk factors for cardiac and pulmonary disease and treatment options.  Reviews associated critical values for Overweight/Obesity, Hypertension, Cholesterol, and Diabetes.  Discusses basics of heart failure: signs/symptoms and treatments.  Introduces Heart Failure Zone chart for action plan for heart failure.  Written material given at graduation. Flowsheet Row Cardiac Rehab from 10/12/2021 in [facility] Cardiac [facility] Pul[facility] Rehab  Education need identified 10/12/21       Infection Prevention: - Provides verbal and written material to individual with discussion of infection control including proper hand washing and proper equipment cleaning during exercise session. Flowsheet Row Cardiac Rehab from 10/12/2021 in [facility] Cardiac and Pul[facility] Rehab  Education need identified 10/12/21  Date 10/12/21  Educator Ruch  Instruction Review Code 1- Verbalizes Understanding       Falls Prevention: - Provides verbal and written material to individual with discussion of falls prevention and safety. Flowsheet Row Cardiac Rehab from 10/12/2021 in [facility] Pul[facility] Re[facility]  Education need identified 10/12/21  Date 10/12/21  Educator Ephraim  Instruction Review Code 1- Verbalizes Understanding       Other: -Provides group and verbal instruction on various topics (see comments)   Knowledge Questionnaire Score:  Knowledge Questionnaire Score -  01/08/22 8338       Knowledge Questionnaire Score   Pre Score 24/26: PAD, Exercise    Post Score 23/26             Core Components/Risk Factors/Patient Goals at Admission:  Personal Goals and Risk Factors at Admission -  10/12/21 1413       Core Components/Risk Factors/Patient Goals on Admission    Weight Management Yes;Weight Loss;Obesity    Intervention Weight Management: Develop a combined nutrition and exercise program designed to reach desired caloric intake, while maintaining appropriate intake of nutrient and fiber, sodium and fats, and appropriate energy expenditure required for the weight goal.;Weight Management: Provide education and appropriate resources to help participant work on and attain dietary goals.;Weight Management/Obesity: Establish reasonable short term and long term weight goals.    Admit Weight 235 lb (106.6 kg)    Goal Weight: Short Term 230 lb (104.3 kg)    Goal Weight: Long Term 210 lb (95.3 kg)    Expected Outcomes Short Term: Continue to assess and modify interventions until short term weight is achieved;Long Term: Adherence to nutrition and physical activity/exercise program aimed toward attainment of established weight goal;Weight Loss: Understanding of general recommendations for a balanced deficit meal plan, which promotes 1-2 lb weight loss per week and includes a negative energy balance of [phone] kcal/d;Understanding recommendations for meals to include 15-35% energy as protein, 25-35% energy from fat, 35-60% energy from carbohydrates, less than 265m of dietary cholesterol, 20-35 gm of total fiber daily;Understanding of distribution of calorie intake throughout the day with the consumption of 4-5 meals/snacks    Diabetes Yes    Intervention Provide education about signs/symptoms and action to take for hypo/hyperglycemia.;Provide education about proper nutrition, including hydration, and aerobic/resistive exercise prescription along with prescribed medications to achieve blood glucose in normal ranges: Fasting glucose 65-99 mg/dL    Expected Outcomes Short Term: Participant verbalizes understanding of the signs/symptoms and immediate care of hyper/hypoglycemia, proper foot care and  importance of medication, aerobic/resistive exercise and nutrition plan for blood glucose control.;Long Term: Attainment of HbA1C < 7%.    Hypertension Yes    Intervention Provide education on lifestyle modifcations including regular physical activity/exercise, weight management, moderate sodium restriction and increased consumption of fresh fruit, vegetables, and low fat dairy, alcohol moderation, and smoking cessation.;Monitor prescription use compliance.    Expected Outcomes Short Term: Continued assessment and intervention until BP is < 140/9107mHG in hypertensive participants. < 130/8089mG in hypertensive participants with diabetes, heart failure or chronic kidney disease.;Long Term: Maintenance of blood pressure at goal levels.    Lipids Yes    Intervention Provide education and support for participant on nutrition & aerobic/resistive exercise along with prescribed medications to achieve LDL <107m29mDL >40mg31m Expected Outcomes Short Term: Participant states understanding of desired cholesterol values and is compliant with medications prescribed. Participant is following exercise prescription and nutrition guidelines.;Long Term: Cholesterol controlled with medications as prescribed, with individualized exercise RX and with personalized nutrition plan. Value goals: LDL < 107mg,86m > 40 mg.             Education:Diabetes - Individual verbal and written instruction to review signs/symptoms of diabetes, desired ranges of glucose level fasting, after meals and with exercise. Acknowledge that pre and post exercise glucose checks will be done for 3 sessions at entry of program. [facility]  Date 09/27/21  Educator [facility]  1- Verbalizes Understanding       Core Components/Risk Factors/Patient Goals Review:   Goals and Risk Factor Review     Row Name 11/13/21 0816 12/13/21 0802 01/05/22 0827          Core Components/Risk Factors/Patient Goals Review   Personal Goals Review Weight Management/Obesity;Diabetes Weight Management/Obesity;Diabetes Weight Management/Obesity;Diabetes     Review Ernest Pennington is checking his sugar at home. His fasting sugars have been around 130 on average. He wants to continue to work on weight loss. Ernest Pennington is unable to lose some weight at the moment and feels his digestive system is not fuctioning properly. He is taking some suppliments to help his digestive system. Ernest Pennington is doing well in rehab.  His weight is staying steady.  His sugars are doing well and he checks them every couple of days.  His blood pressures continue to do well and he will check on occasion at home. Ernest Pennington has done to well in rehab.  His weight is staying steady.  He has better control over his sugar and feels better overall.     Expected Outcomes Short: lose 5 pounds on the next couple weeks. Long: reach a weight goal of 215 pounds. Short: Continue to work on weight loss Long: Continue to monitor risk factors. Continue to montior risk factors              Core Components/Risk Factors/Patient Goals at Discharge (Final Review):   Goals and Risk Factor Review - 01/05/22 0827       Core Components/Risk Factors/Patient Goals Review   Personal Goals Review Weight Management/Obesity;Diabetes    Review Ernest Pennington has done to well in rehab.  His weight is staying steady.  He has better control over his sugar and feels better overall.    Expected Outcomes Continue to montior risk factors             ITP Comments:  ITP Comments     Row Name 09/27/21 1418 10/12/21 1122 10/25/21 0832 11/22/21 1331 12/20/21 0841   ITP Comments Initial telephone orientation completed. Diagnosis can be found in [facility] 1/26. EP orientation scheduled for Thursday 3/9 at 10am. Completed 6MWT and gym orientation. Initial ITP created and sent for review to Dr. Emily Filbert, Medical Director. 30 Day review completed. Medical Director ITP  review done, changes made as directed, and signed approval by Medical Director.    New to program 30 Day review completed. Medical Director ITP review done, changes made as directed, and signed approval by Medical Director. 30 Day review completed. Medical Director ITP review done, changes made as directed, and signed approval by Medical Director.    Louise Name 12/26/21 1553           ITP Comments Ernest Pennington was out this week with an allergic reaction to his BP meds.                Comments: Discharge ITP

## 2022-01-12 NOTE — Progress Notes (Signed)
Discharge NOTE [name]  DOB 03-22-1956   Ernest Pennington graduated today from  rehab with 31 sessions completed.  Details of the patient's exercise prescription and what He needs to do in order to continue the prescription and progress were discussed with patient.  Patient was given a copy of prescription and goals.  Patient verbalized understanding.  [name]  plans to continue to exercise by walking.   [name] Name 10/12/21 1407 01/03/22 0828       6 Minute Walk   Phase Initial Discharge    Distance 1350 feet 1400 feet    Distance % Change -- 3.7 %    Distance Feet Change -- 50 ft    Walk Time 6 minutes 6 minutes    # of Rest Breaks 0 0    MPH 2.55 2.65    METS 3.14 3    RPE 7 7    Perceived Dyspnea  1 --    VO2 Peak 11.01 10.52    Symptoms No No    Resting HR 65 bpm 57 bpm    Resting BP 158/86 124/66    Resting Oxygen Saturation  98 % 96 %    Exercise Oxygen Saturation  during 6 min walk 98 % 95 %    Max Ex. HR 83 bpm 88 bpm    Max Ex. BP 172/88 134/64    2 Minute Post BP 152/84 --            Thank you for the referral.

## 2022-04-15 ENCOUNTER — Emergency Department: Payer: Medicare Other

## 2022-04-15 ENCOUNTER — Other Ambulatory Visit: Payer: Self-pay

## 2022-04-15 ENCOUNTER — Emergency Department
Admission: EM | Admit: 2022-04-15 | Discharge: 2022-04-15 | Disposition: A | Discharge status: Home / Self Care | Payer: Medicare Other | Attending: Emergency Medicine | Admitting: Emergency Medicine

## 2022-04-15 ENCOUNTER — Encounter: Payer: Self-pay | Admitting: Emergency Medicine

## 2022-04-15 DIAGNOSIS — E119 Type 2 diabetes mellitus without complications: Secondary | ICD-10-CM | POA: Diagnosis not present

## 2022-04-15 DIAGNOSIS — Z20822 Contact with and (suspected) exposure to covid-19: Secondary | ICD-10-CM | POA: Insufficient documentation

## 2022-04-15 DIAGNOSIS — R519 Headache, unspecified: Secondary | ICD-10-CM | POA: Insufficient documentation

## 2022-04-15 DIAGNOSIS — R053 Chronic cough: Secondary | ICD-10-CM | POA: Diagnosis not present

## 2022-04-15 DIAGNOSIS — F419 Anxiety disorder, unspecified: Secondary | ICD-10-CM | POA: Diagnosis not present

## 2022-04-15 DIAGNOSIS — I509 Heart failure, unspecified: Secondary | ICD-10-CM | POA: Insufficient documentation

## 2022-04-15 DIAGNOSIS — Z7984 Long term (current) use of oral hypoglycemic drugs: Secondary | ICD-10-CM | POA: Diagnosis not present

## 2022-04-15 DIAGNOSIS — Z8546 Personal history of malignant neoplasm of prostate: Secondary | ICD-10-CM | POA: Insufficient documentation

## 2022-04-15 DIAGNOSIS — Z951 Presence of aortocoronary bypass graft: Secondary | ICD-10-CM | POA: Insufficient documentation

## 2022-04-15 DIAGNOSIS — I2510 Atherosclerotic heart disease of native coronary artery without angina pectoris: Secondary | ICD-10-CM | POA: Diagnosis not present

## 2022-04-15 DIAGNOSIS — Z7901 Long term (current) use of anticoagulants: Secondary | ICD-10-CM | POA: Diagnosis not present

## 2022-04-15 DIAGNOSIS — Z79899 Other long term (current) drug therapy: Secondary | ICD-10-CM | POA: Diagnosis not present

## 2022-04-15 DIAGNOSIS — R059 Cough, unspecified: Secondary | ICD-10-CM | POA: Diagnosis present

## 2022-04-15 DIAGNOSIS — I110 Hypertensive heart disease with heart failure: Secondary | ICD-10-CM | POA: Insufficient documentation

## 2022-04-15 DIAGNOSIS — Z7982 Long term (current) use of aspirin: Secondary | ICD-10-CM | POA: Diagnosis not present

## 2022-04-15 DIAGNOSIS — I10 Essential (primary) hypertension: Secondary | ICD-10-CM

## 2022-04-15 LAB — BASIC METABOLIC PANEL
Anion gap: 8 (ref 5–15)
BUN: 20 mg/dL (ref 8–23)
CO2: 29 mmol/L (ref 22–32)
Calcium: 9.4 mg/dL (ref 8.9–10.3)
Chloride: 100 mmol/L (ref 98–111)
Creatinine, Ser: 1.14 mg/dL (ref 0.61–1.24)
GFR, Estimated: >60 mL/min (ref >60)
Glucose, Bld: 186 mg/dL — ABNORMAL HIGH (ref 70–99)
Potassium: 3.8 mmol/L (ref 3.5–5.1)
Sodium: 137 mmol/L (ref 135–145)

## 2022-04-15 LAB — HEPATIC FUNCTION PANEL
ALT: 49 U/L — ABNORMAL HIGH (ref 0–44)
AST: 36 U/L (ref 15–41)
Albumin: 3.6 g/dL (ref 3.5–5.0)
Alkaline Phosphatase: 58 U/L (ref 38–126)
Bilirubin, Direct: 0.1 mg/dL (ref 0.0–0.2)
Indirect Bilirubin: 0.4 mg/dL (ref 0.3–0.9)
Total Bilirubin: 0.5 mg/dL (ref 0.3–1.2)
Total Protein: 7.4 g/dL (ref 6.5–8.1)

## 2022-04-15 LAB — CBC
HCT: 49.3 % (ref 39.0–52.0)
Hemoglobin: 15.8 g/dL (ref 13.0–17.0)
MCH: 26.9 pg (ref 26.0–34.0)
MCHC: 32 g/dL (ref 30.0–36.0)
MCV: 84 fL (ref 80.0–100.0)
Platelets: 209 10*3/uL (ref 150–400)
RBC: 5.87 MIL/uL — ABNORMAL HIGH (ref 4.22–5.81)
RDW: 14.4 % (ref 11.5–15.5)
WBC: 11.1 10*3/uL — ABNORMAL HIGH (ref 4.0–10.5)
nRBC: 0 % (ref 0.0–0.2)

## 2022-04-15 LAB — SARS CORONAVIRUS 2 BY RT PCR: SARS Coronavirus 2 by RT PCR: NEGATIVE

## 2022-04-15 LAB — LIPASE, BLOOD: Lipase: 29 U/L (ref 11–51)

## 2022-04-15 LAB — TROPONIN I (HIGH SENSITIVITY)
Troponin I (High Sensitivity): 6 ng/L (ref <18)
Troponin I (High Sensitivity): 5 ng/L (ref <18)

## 2022-04-15 LAB — BRAIN NATRIURETIC PEPTIDE: B Natriuretic Peptide: 26.5 pg/mL (ref 0.0–100.0)

## 2022-04-15 MED ORDER — FAMOTIDINE IN NACL 20-0.9 MG/50ML-% IV SOLN
20.0000 mg | Freq: Once | INTRAVENOUS | Status: AC
  Administered 2022-04-15: 20 mg via INTRAVENOUS
  Filled 2022-04-15: qty 50

## 2022-04-15 MED ORDER — LORAZEPAM 2 MG/ML IJ SOLN
1.0000 mg | Freq: Once | INTRAMUSCULAR | Status: AC
  Administered 2022-04-15: 1 mg via INTRAVENOUS
  Filled 2022-04-15: qty 1

## 2022-04-15 MED ORDER — HYDROCOD POLI-CHLORPHE POLI ER 10-8 MG/5ML PO SUER
5.0000 mL | Freq: Once | ORAL | Status: AC
  Administered 2022-04-15: 5 mL via ORAL
  Filled 2022-04-15: qty 5

## 2022-04-15 NOTE — ED Notes (Signed)
Offered patient tussionex at this time, but he refused, stating he already took some this AM prior to arrival. Spouse at bedside does not think he took any before leaving their house. Pt drowsy from prior administered meds, will hold tussionex for now until patient is more alert.

## 2022-04-15 NOTE — ED Provider Notes (Signed)
-----------------------------------------   7:08 AM on 04/15/2022 -----------------------------------------  Blood pressure (!) 173/115, pulse 73, temperature 98.2 F (36.8 C), resp. rate (!) 22, height 5' 10.5" (1.791 m), weight 105.2 kg, SpO2 98 %.  Assuming care from Dr. Beather Arbour.  In short, Ernest Pennington is a 66 y.o. male with a chief complaint of Chest Pain and Cough .  Refer to the original H&P for additional details.  The current plan of care is to follow-up troponin COVID testing and reassess for cough and chest pain.  ----------------------------------------- 8:30 AM on 04/15/2022 ----------------------------------------- Repeat troponin within normal limits and COVID testing is negative.  Patient reports feeling much better on reassessment, blood pressure gradually improving, and he is appropriate for discharge home with PCP follow-up.  Patient counseled to return to the ED for new or worsening symptoms, patient and spouse agree with plan.    Blake Divine, MD 04/15/22 [phone]

## 2022-04-15 NOTE — ED Provider Notes (Signed)
[facility] Provider Note    Event Date/Time   First MD Initiated Contact with Patient 04/15/22 [phone]     (approximate)   History   Chest Pain and Cough   HPI  Ernest Pennington is a 66 y.o. male who presents to the ED from home with a chief complaint of indigestion, nausea/vomiting x3, anxious and cough x9 months since CABG.  Also complains of headache.  Saw his cardiologist at the end of August who took him off his blood pressure medicine to see if that would improve his cough.  Spouse has noticed some improvement in cough.  He was told to watch his blood pressure.  States he felt like he had a panic attack tonight.  Denies chest pain but complains of a feeling of indigestion.  Ate pizza for dinner.  Denies fever, shortness of breath, diaphoresis, abdominal pain, nausea, vomiting or dizziness.  He is frustrated and anxious at his prolonged symptomology.  Denies recent travel, trauma or hormone use.     Past Medical History   Past Medical History:  Diagnosis Date   ATRIAL SEPTAL DEFECT    Back pain    CAD    Diverticulitis    History of rectal bleeding    HYPERLIPIDEMIA    HYPERTENSION    Pre-diabetes      Active Problem List   Patient Active Problem List   Diagnosis Date Noted   Paroxysmal atrial fibrillation ([place]) 07/24/2021   Chronic bilateral low back pain with bilateral sciatica 06/27/2020   History of colonic polyps    Blood glucose elevated 11/18/2017   Gout 05/21/2016   Malignant neoplasm of prostate ([place]) 04/26/2016   Benign prostatic hyperplasia with urinary obstruction 04/07/2015   Incomplete bladder emptying 04/07/2015   Elevated prostate specific antigen (PSA) 03/15/2015   Cannot sleep 12/06/2014   Family history of malignant neoplasm of prostate 12/06/2014   History of atrial septal defect repair 12/06/2014   Type 2 diabetes mellitus ([place]) 12/06/2014   Hernia, inguinal, right 10/25/2014   Kidney cysts 10/25/2014   Apnea,  sleep 01/05/2013   Fatty infiltration of liver 09/08/2012   Breath shortness 12/07/2011   ATRIAL SEPTAL DEFECT 06/03/2009   HYPERLIPIDEMIA 06/02/2009   HYPERTENSION 06/02/2009   CAD 06/02/2009     Past Surgical History   Past Surgical History:  Procedure Laterality Date   COLONOSCOPY WITH PROPOFOL N/A 12/05/2017   Procedure: COLONOSCOPY WITH PROPOFOL;  Surgeon: Lin Landsman, MD;  Location: [facility] ENDOSCOPY;  Service: Gastroenterology;  Laterality: N/A;   FOOT SURGERY     HERNIA REPAIR     LEFT HEART CATH AND CORONARY ANGIOGRAPHY N/A 08/18/2021   Procedure: LEFT HEART CATH AND CORONARY ANGIOGRAPHY;  Surgeon: Andrez Grime, MD;  Location: [facility];  Service: Cardiovascular;  Laterality: N/A;   PROSTATE SURGERY  2018   repair of heart defect       Home Medications   Prior to Admission medications   Medication Sig Start Date End Date Taking? Authorizing Provider  allopurinol (ZYLOPRIM) 300 MG tablet Take 300 mg by mouth daily.   Patient not taking: Reported on 09/27/2021    [provider]  apixaban (ELIQUIS) 5 MG TABS tablet Take 5 mg by mouth 2 (two) times daily.    [provider]  aspirin 81 MG tablet Take 81 mg by mouth daily.      [provider]  bumetanide (BUMEX) 0.5 MG tablet Take by mouth. 09/05/21 09/05/22  [provider]  cloNIDine (CATAPRES) 0.1 MG tablet Take 0.1 mg by mouth 2 (two) times daily. Patient not taking: Reported on 09/27/2021    [provider]  diltiazem (CARDIZEM CD) 240 MG 24 hr capsule Take 1 capsule (240 mg total) by mouth daily. Patient not taking: Reported on 09/27/2021 08/18/21   Andrez Grime, MD  empagliflozin (JARDIANCE) 10 MG TABS tablet Take 1 tablet by mouth daily. 09/05/21 09/05/22  [provider]  metoprolol tartrate (LOPRESSOR) 25 MG tablet Take by mouth. 09/05/21 09/05/22  [provider]  rosuvastatin (CRESTOR) 10 MG tablet Take 10 mg by mouth daily.     [provider]  sitaGLIPtin (JANUVIA) 50 MG tablet Take 1 tablet (50 mg total) by mouth daily. 08/18/21   Andrez Grime, MD  spironolactone (ALDACTONE) 50 MG tablet Take 1 tablet (50 mg total) by mouth daily. Patient not taking: Reported on 09/27/2021 08/18/21   Andrez Grime, MD  traZODone (DESYREL) 50 MG tablet Take 50 mg by mouth at bedtime as needed for sleep. 09/13/14   [provider]     Allergies  Amlodipine, Furosemide, Tamsulosin, Amlodipine besy-benazepril hcl, Atenolol, Metformin, and Statins   Family History  History reviewed. No pertinent family history.   Physical Exam  Triage Vital Signs: ED Triage Vitals  Enc Vitals Group     BP 04/15/22 0546 (!) 173/126     Pulse Rate 04/15/22 0546 81     Resp 04/15/22 0546 10     Temp 04/15/22 0546 98.2 F (36.8 C)     Temp src --      SpO2 04/15/22 0546 98 %     Weight 04/15/22 0544 232 lb (105.2 kg)     Height 04/15/22 0544 5' 10.5" (1.791 m)     Head Circumference --      Peak Flow --      Pain Score 04/15/22 0544 0     Pain Loc --      Pain Edu? --      Excl. in [place]? --     Updated Vital Signs: BP (!) 173/115   Pulse 73   Temp 98.2 F (36.8 C)   Resp (!) 22   Ht 5' 10.5" (1.791 m)   Wt 105.2 kg   SpO2 98%   BMI 32.82 kg/m    General: Awake, mild distress.  CV:  RRR.  Good peripheral perfusion.  Resp:  Normal effort.  CTA B. Abd:  Nontender.  No distention.  Other:  Calves are nontender and nonswollen.   ED Results / Procedures / Treatments  Labs (all labs ordered are listed, but only abnormal results are displayed) Labs Reviewed  BASIC METABOLIC PANEL - Abnormal; Notable for the following components:      Result Value   Glucose, Bld 186 (*)    All other components within normal limits  CBC - Abnormal; Notable for the following components:   WBC 11.1 (*)    RBC 5.87 (*)    All other components within normal limits  SARS CORONAVIRUS 2 BY RT PCR  BRAIN NATRIURETIC  PEPTIDE  HEPATIC FUNCTION PANEL  LIPASE, BLOOD  TROPONIN I (HIGH SENSITIVITY)     EKG  ED ECG REPORT I, Freemon Binford J, the attending physician, personally viewed and interpreted this ECG.   Date: 04/15/2022  EKG Time: 0545  Rate: 80  Rhythm: normal sinus rhythm  Axis: Normal  Intervals:right bundle branch block  ST&T Change: Nonspecific  RADIOLOGY I have independently visualized and interpreted patient's chest x-ray and CT head as well as noted the radiology interpretation:  Chest x-ray: No acute cardiopulmonary process  CT head: No [facility]  Official radiology report(s): CT Head Wo Contrast  Result Date: 04/15/2022 CLINICAL DATA:  Headache. EXAM: CT HEAD WITHOUT CONTRAST TECHNIQUE: Contiguous axial images were obtained from the base of the skull through the vertex without intravenous contrast. RADIATION DOSE REDUCTION: This exam was performed according to the departmental dose-optimization program which includes automated exposure control, adjustment of the mA and/or kV according to patient size and/or use of iterative reconstruction technique. COMPARISON:  None Available. FINDINGS: Brain: No evidence of acute infarction, hemorrhage, hydrocephalus, extra-axial collection or mass lesion/mass effect. There is mild diffuse low-attenuation within the subcortical and periventricular white matter compatible with chronic microvascular disease. Vascular: No hyperdense vessel or unexpected calcification. Skull: Normal. Negative for fracture or focal lesion. Sinuses/Orbits: Mucosal thickening noted involving the frontal sinus, right ethmoid air cells, and right maxillary sinus. Mastoid air cells are clear. Other: None. IMPRESSION: 1. No acute intracranial abnormalities. 2. Chronic small vessel ischemic disease. 3. Sinus inflammation. Electronically Signed   By: Kerby Moors M.D.   On: 04/15/2022 06:36   DG Chest Port 1 View  Result Date: 04/15/2022 CLINICAL DATA:  Chest pain EXAM: PORTABLE  CHEST 1 VIEW COMPARISON:  10/16/2011 FINDINGS: Normal heart size. Median sternotomy and left atrial clipping. Artifact from EKG leads. There is no edema, consolidation, effusion, or pneumothorax. IMPRESSION: No evidence of acute disease. Electronically Signed   By: Jorje Guild M.D.   On: 04/15/2022 06:11     PROCEDURES:  Critical Care performed: Yes, see critical care procedure note(s)  CRITICAL CARE Performed by: Paulette Blanch   Total critical care time: 20 minutes  Critical care time was exclusive of separately billable procedures and treating other patients.  Critical care was necessary to treat or prevent imminent or life-threatening deterioration.  Critical care was time spent personally by me on the following activities: development of treatment plan with patient and/or surrogate as well as nursing, discussions with consultants, evaluation of patient's response to treatment, examination of patient, obtaining history from patient or surrogate, ordering and performing treatments and interventions, ordering and review of laboratory studies, ordering and review of radiographic studies, pulse oximetry and re-evaluation of patient's condition.   Marland Kitchen1-3 Lead EKG Interpretation  Performed by: Paulette Blanch, MD Authorized by: Paulette Blanch, MD     Interpretation: normal     ECG rate:  80   ECG rate assessment: normal     Rhythm: sinus rhythm     Ectopy: none     Conduction: normal   Comments:     Patient placed on cardiac monitor to evaluate for arrhythmias    MEDICATIONS ORDERED IN ED: Medications  chlorpheniramine-HYDROcodone (TUSSIONEX) 10-8 MG/5ML suspension 5 mL (has no administration in time range)  famotidine (PEPCID) IVPB 20 mg premix (has no administration in time range)  LORazepam (ATIVAN) injection 1 mg (has no administration in time range)     IMPRESSION / MDM / ASSESSMENT AND PLAN / ED COURSE  I reviewed the triage vital signs and the nursing notes.                              66 year old male presenting with cough x9 months, indigestion, anxiety. Differential diagnosis includes, but is not limited to, ACS, aortic dissection, pulmonary embolism, cardiac tamponade, pneumothorax,  pneumonia, pericarditis, myocarditis, GI-related causes including esophagitis/gastritis, and musculoskeletal chest wall pain.   I have personally reviewed patient's records and note his cardiology office visit from 04/04/2022.  Patient's presentation is most consistent with acute presentation with potential threat to life or bodily function.  The patient is on the cardiac monitor to evaluate for evidence of arrhythmia and/or significant heart rate changes.  We will obtain cardiac panel, chest x-ray.  Administer IV Pepcid, Tussionex for cough, IV Ativan for anxiety.  Consider antihypertensive if blood pressure does not improve after IV Ativan.  BP currently 188/100.  Obtain COVID-19 swab.  Clinical Course as of 04/15/22 7517  Sun Apr 15, 2022  0653 There will be transferred to the oncoming provider Dr. Charna Archer at change of shift for reassessment, follow-up hepatic panel/lipase/COVID, repeat troponin and disposition. [JS]    Clinical Course User Index [JS] Paulette Blanch, MD     FINAL CLINICAL IMPRESSION(S) / ED DIAGNOSES   Final diagnoses:  Chronic cough  Essential hypertension     Rx / DC Orders   ED Discharge Orders     None        Note:  This document was prepared using Dragon voice recognition software and may include unintentional dictation errors.   Paulette Blanch, MD 04/15/22 [phone]

## 2022-04-15 NOTE — ED Triage Notes (Signed)
Pt states had open heart surgery approx 8 months ago at [facility], states Dr. Clayborn Bigness is his cardiologist. Pt states 6 episodes of emesis. Pt also c/o feeling like his head is splitting. Pt states had constant cough at night. Pt also c/o SOB. Pt reports pain in his chest, unsure if it is from his cough. Pt noted to be hypertensive, pt also reports increased fatigue.   Pt denies pain, states has been sore in his chest since he has developed the cough.

## 2022-04-15 NOTE — ED Notes (Signed)
AVS provided to and discussed with patient and family member at bedside. Pt verbalizes understanding of discharge instructions and denies any questions or concerns at this time. Pt has ride home. Pt ambulated out of department independently with steady gait.  

## 2022-04-15 NOTE — ED Triage Notes (Addendum)
FIRST NURSE NOTE:  Pt c/o chest tightness and reports vomiting, pt appears anxious, states he has been dealing with the chest tightness for several months. Pt  has hx of CABG,  Pt also reports cough.

## 2022-08-15 ENCOUNTER — Other Ambulatory Visit: Payer: Self-pay

## 2022-08-15 ENCOUNTER — Emergency Department: Payer: Medicare Other

## 2022-08-15 ENCOUNTER — Emergency Department
Admission: EM | Admit: 2022-08-15 | Discharge: 2022-08-15 | Disposition: A | Discharge status: Home / Self Care | Payer: Medicare Other | Attending: Emergency Medicine | Admitting: Emergency Medicine

## 2022-08-15 DIAGNOSIS — Z1152 Encounter for screening for COVID-19: Secondary | ICD-10-CM | POA: Diagnosis not present

## 2022-08-15 DIAGNOSIS — Z8673 Personal history of transient ischemic attack (TIA), and cerebral infarction without residual deficits: Secondary | ICD-10-CM | POA: Diagnosis not present

## 2022-08-15 DIAGNOSIS — I10 Essential (primary) hypertension: Secondary | ICD-10-CM

## 2022-08-15 DIAGNOSIS — Z7901 Long term (current) use of anticoagulants: Secondary | ICD-10-CM | POA: Insufficient documentation

## 2022-08-15 DIAGNOSIS — R0602 Shortness of breath: Secondary | ICD-10-CM | POA: Insufficient documentation

## 2022-08-15 DIAGNOSIS — R079 Chest pain, unspecified: Secondary | ICD-10-CM | POA: Diagnosis present

## 2022-08-15 DIAGNOSIS — R0789 Other chest pain: Secondary | ICD-10-CM

## 2022-08-15 DIAGNOSIS — E119 Type 2 diabetes mellitus without complications: Secondary | ICD-10-CM | POA: Diagnosis not present

## 2022-08-15 DIAGNOSIS — Z7982 Long term (current) use of aspirin: Secondary | ICD-10-CM | POA: Insufficient documentation

## 2022-08-15 LAB — BASIC METABOLIC PANEL
Anion gap: 10 (ref 5–15)
BUN: 22 mg/dL (ref 8–23)
CO2: 24 mmol/L (ref 22–32)
Calcium: 9.1 mg/dL (ref 8.9–10.3)
Chloride: 99 mmol/L (ref 98–111)
Creatinine, Ser: 1.02 mg/dL (ref 0.61–1.24)
GFR, Estimated: >60 mL/min (ref >60)
Glucose, Bld: 163 mg/dL — ABNORMAL HIGH (ref 70–99)
Potassium: 3.5 mmol/L (ref 3.5–5.1)
Sodium: 133 mmol/L — ABNORMAL LOW (ref 135–145)

## 2022-08-15 LAB — RESP PANEL BY RT-PCR (RSV, FLU A&B, COVID)  RVPGX2
Influenza A by PCR: NEGATIVE
Influenza B by PCR: NEGATIVE
Resp Syncytial Virus by PCR: NEGATIVE
SARS Coronavirus 2 by RT PCR: NEGATIVE

## 2022-08-15 LAB — URINALYSIS, ROUTINE W REFLEX MICROSCOPIC
Bilirubin Urine: NEGATIVE
Glucose, UA: NEGATIVE mg/dL
Ketones, ur: NEGATIVE mg/dL
Leukocytes,Ua: NEGATIVE
Nitrite: NEGATIVE
Protein, ur: 30 mg/dL — AB
Specific Gravity, Urine: >1.046 — ABNORMAL HIGH (ref 1.005–1.030)
pH: 7 (ref 5.0–8.0)

## 2022-08-15 LAB — CBC
HCT: 47.3 % (ref 39.0–52.0)
Hemoglobin: 15.4 g/dL (ref 13.0–17.0)
MCH: 27.2 pg (ref 26.0–34.0)
MCHC: 32.6 g/dL (ref 30.0–36.0)
MCV: 83.6 fL (ref 80.0–100.0)
Platelets: 189 10*3/uL (ref 150–400)
RBC: 5.66 MIL/uL (ref 4.22–5.81)
RDW: 15.2 % (ref 11.5–15.5)
WBC: 12.4 10*3/uL — ABNORMAL HIGH (ref 4.0–10.5)
nRBC: 0 % (ref 0.0–0.2)

## 2022-08-15 LAB — HEPATIC FUNCTION PANEL
ALT: 44 U/L (ref 0–44)
AST: 35 U/L (ref 15–41)
Albumin: 3.7 g/dL (ref 3.5–5.0)
Alkaline Phosphatase: 62 U/L (ref 38–126)
Bilirubin, Direct: 0.2 mg/dL (ref 0.0–0.2)
Indirect Bilirubin: 0.8 mg/dL (ref 0.3–0.9)
Total Bilirubin: 1 mg/dL (ref 0.3–1.2)
Total Protein: 7.8 g/dL (ref 6.5–8.1)

## 2022-08-15 LAB — BRAIN NATRIURETIC PEPTIDE: B Natriuretic Peptide: 103.7 pg/mL — ABNORMAL HIGH (ref 0.0–100.0)

## 2022-08-15 LAB — TROPONIN I (HIGH SENSITIVITY)
Troponin I (High Sensitivity): 8 ng/L (ref <18)
Troponin I (High Sensitivity): 10 ng/L (ref <18)

## 2022-08-15 LAB — LIPASE, BLOOD: Lipase: 33 U/L (ref 11–51)

## 2022-08-15 MED ORDER — ACETAMINOPHEN 325 MG PO TABS
650.0000 mg | ORAL_TABLET | Freq: Once | ORAL | Status: AC
  Administered 2022-08-15: 650 mg via ORAL
  Filled 2022-08-15: qty 2

## 2022-08-15 MED ORDER — DIPHENHYDRAMINE HCL 50 MG/ML IJ SOLN
12.5000 mg | Freq: Once | INTRAMUSCULAR | Status: AC
  Administered 2022-08-15: 12.5 mg via INTRAVENOUS
  Filled 2022-08-15: qty 1

## 2022-08-15 MED ORDER — PROCHLORPERAZINE EDISYLATE 10 MG/2ML IJ SOLN
5.0000 mg | Freq: Once | INTRAMUSCULAR | Status: AC
  Administered 2022-08-15: 5 mg via INTRAVENOUS
  Filled 2022-08-15: qty 2

## 2022-08-15 MED ORDER — IOHEXOL 350 MG/ML SOLN
75.0000 mL | Freq: Once | INTRAVENOUS | Status: AC | PRN
  Administered 2022-08-15: 75 mL via INTRAVENOUS

## 2022-08-15 MED ORDER — NITROGLYCERIN 0.3 MG SL SUBL: 0.3000 mg | SUBLINGUAL_TABLET | SUBLINGUAL | 0 refills | Status: AC | PRN

## 2022-08-15 NOTE — ED Triage Notes (Signed)
Pt here via [facility] from home with CP that started at 0530. Pt states also has a cough and gagging. Pt hx of open heart surgery a year ago at [facility], 1 stent in his heart. Hx of htn and DM. 4 aspirin and 1 spray of nitro given by ems. 18 LAC.   151/101 100 95% 4L [id number]

## 2022-08-15 NOTE — ED Provider Triage Note (Signed)
Emergency Medicine Provider Triage Evaluation Note  Ernest Pennington, a 67 y.o. male  was evaluated in triage.  Pt complains of malaise for 3 weeks, and chest pressure today. He presents from home via EMS for evaluation. He also endorses elevated BP readings over the last several days. His cardiologist increased his BP meds 2 days ago. He would report symptoms of fatigue and malaise since Christmas. He is on Eliquis for CVA history. Patient found to be febrile on triage.   Review of Systems  Positive: Chest pressure, malaise Negative: NVD  Physical Exam  BP (!) 144/98 (BP Location: Right Arm)   Pulse (!) 109   Temp 100.2 F (37.9 C) (Oral)   Resp 20   Ht 5' 10.5" (1.791 m)   Wt 105.2 kg   SpO2 93%   BMI 32.81 kg/m  Gen:   Awake, no distress  NAD Resp:  Normal effort  MSK:   Moves extremities without difficulty  Other:    Medical Decision Making  Medically screening exam initiated at 11:14 AM.  Appropriate orders placed.  Ernest Pennington was informed that the remainder of the evaluation will be completed by another provider, this initial triage assessment does not replace that evaluation, and the importance of remaining in the ED until their evaluation is complete.  Patient to the ED from home for chest pressure, elevated BP, and malaise.   Melvenia Needles, PA-C 08/15/22 1117

## 2022-08-15 NOTE — Discharge Instructions (Signed)
For your blood pressure:  Try to only check this once to twice daily if you are otherwise feeling at baseline  If you develop symptoms and blood pressure is high, repeat in 10 minutes after resting. If it remains high and you have chest pain/discomfort, nausea, or other symptoms, you can take the nitroglycerin as indicated

## 2022-08-15 NOTE — ED Notes (Signed)
Oxygen saturation stayed between 95-100 while pt was ambulating with RN.

## 2022-08-15 NOTE — ED Provider Notes (Signed)
[facility] Provider Note    Event Date/Time   First MD Initiated Contact with Patient 08/15/22 1152     (approximate)   History   Chest Pain   HPI  Jeffren Dombek is a 67 y.o. male  here with chest pain, nausea.  Patient arrives with his wife. Pt states that over the past 2 weeks, he has had progressively elevating BP. He has had associated occasional.  2 times and reassess.  Blood pressure went elevated so he presents evaluation.  States he has had some intermittent chest discomfort but this has occurred intermittently in the past. The nausea and weakness is new, however. He has had some general fatigue. No headache.      Physical Exam   Triage Vital Signs: ED Triage Vitals  Enc Vitals Group     BP 08/15/22 1106 (!) 144/98     Pulse Rate 08/15/22 1106 (!) 109     Resp 08/15/22 1106 20     Temp 08/15/22 1106 100.2 F (37.9 C)     Temp Source 08/15/22 1106 Oral     SpO2 08/15/22 1106 93 %     Weight 08/15/22 1109 231 lb 14.8 oz (105.2 kg)     Height 08/15/22 1109 5' 10.5" (1.791 m)     Head Circumference --      Peak Flow --      Pain Score --      Pain Loc --      Pain Edu? --      Excl. in [place]? --     Most recent vital signs: Vitals:   08/15/22 1647 08/15/22 1656  BP: (!) 105/55   Pulse: (!) 39   Resp: 18   Temp: 98.1 F (36.7 C) 98.9 F (37.2 C)  SpO2: 97%      General: Awake, no distress.  CV:  Good peripheral perfusion.  Regular rate and rhythm. Resp:  Normal effort.  Clear to auscultation bilaterally. Abd:  No distention.  No tenderness. Other:  Anxious appearing.  Soft and nontender.  Right lower extremity edema.   ED Results / Procedures / Treatments   Labs (all labs ordered are listed, but only abnormal results are displayed) Labs Reviewed  BASIC METABOLIC PANEL - Abnormal; Notable for the following components:      Result Value   Sodium 133 (*)    Glucose, Bld 163 (*)    All other components within normal  limits  CBC - Abnormal; Notable for the following components:   WBC 12.4 (*)    All other components within normal limits  BRAIN NATRIURETIC PEPTIDE - Abnormal; Notable for the following components:   B Natriuretic Peptide 103.7 (*)    All other components within normal limits  URINALYSIS, ROUTINE W REFLEX MICROSCOPIC - Abnormal; Notable for the following components:   Color, Urine YELLOW (*)    APPearance CLEAR (*)    Specific Gravity, Urine >1.046 (*)    Hgb urine dipstick SMALL (*)    Protein, ur 30 (*)    Bacteria, UA RARE (*)    All other components within normal limits  RESP PANEL BY RT-PCR (RSV, FLU A&B, COVID)  [id number]  HEPATIC FUNCTION PANEL  LIPASE, BLOOD  TROPONIN I (HIGH SENSITIVITY)  TROPONIN I (HIGH SENSITIVITY)     EKG Ventricular rate 111, QRS 90, QTc 614.  Patient has no acute ST elevations or depressions.  No EKG evidence of acute ischemia or infarct.  Sinus rhythm.  RADIOLOGY CXR: Clear   I also independently reviewed and agree with radiologist interpretations.   PROCEDURES:  Critical Care performed: No    MEDICATIONS ORDERED IN ED: Medications  acetaminophen (TYLENOL) tablet 650 mg (650 mg Oral Given 08/15/22 1212)  iohexol (OMNIPAQUE) 350 MG/ML injection 75 mL (75 mLs Intravenous Contrast Given 08/15/22 1325)  diphenhydrAMINE (BENADRYL) injection 12.5 mg (12.5 mg Intravenous Given 08/15/22 1452)  prochlorperazine (COMPAZINE) injection 5 mg (5 mg Intravenous Given 08/15/22 1452)     IMPRESSION / MDM / [place] / ED COURSE  I reviewed the triage vital signs and the nursing notes.                              Differential diagnosis includes, but is not limited to, arrhythmia, angina, ACS, anxiety, gastritis/GERD.  Patient's presentation is most consistent with acute presentation with potential threat to life or bodily function.  The patient is on the cardiac monitor to evaluate for evidence of arrhythmia and/or significant heart rate  changes  67 yo M here with high BP, nausea, intermittent SOB and lightheadedness. Pt has an extensive h/o similar sx, and was just seen by Dr. Clayborn Bigness for this 2 days ago. He has had recurrent ED visits for HTN, CP and unclear whether this is primary anxiety versus ACS versus arrhythmia, as he does have a h/o AFib. On exam here, pt is well appearing, in NAD. CXR negative and given the recurrence of his sx with some pleuritic component to his pain, CT angio obtained and is negative for PE. EKG nonischemic/at baseline and trop neg x 2 - do not suspect ACS. CBC with minimal leukocytosis. COVID negative. BNP slightly elevated though he has no signs of failure on exam. LFTs normal.   Discussed his case with Dr. Clayborn Bigness. The only sx that seem to be consistent are elevated BP, chest pressure, and some nausea. I suspect there could be a component of anxiety playing into this as well, though he does have a h/o CABG. Will trial nitro PRN chest pain and HTN and have him f/u closely as outpt. No apparent emergent pathology at this time.   FINAL CLINICAL IMPRESSION(S) / ED DIAGNOSES   Final diagnoses:  Atypical chest pain  Primary hypertension     Rx / DC Orders   ED Discharge Orders          Ordered    nitroGLYCERIN (NITROSTAT) 0.3 MG SL tablet  Every 5 min PRN        08/15/22 1551             Note:  This document was prepared using Dragon voice recognition software and may include unintentional dictation errors.   Duffy Bruce, MD 08/15/22 2145

## 2022-10-11 ENCOUNTER — Other Ambulatory Visit
Admission: RE | Admit: 2022-10-11 | Discharge: 2022-10-11 | Disposition: A | Discharge status: Home / Self Care | Payer: Medicare Other | Source: Ambulatory Visit | Attending: Specialist | Admitting: Specialist

## 2022-10-11 DIAGNOSIS — R079 Chest pain, unspecified: Secondary | ICD-10-CM | POA: Diagnosis present

## 2022-10-11 LAB — D-DIMER, QUANTITATIVE: D-Dimer, Quant: 0.88 ug/mL-FEU — ABNORMAL HIGH (ref 0.00–0.50)

## 2022-10-12 ENCOUNTER — Other Ambulatory Visit: Payer: Self-pay | Admitting: Specialist

## 2022-10-12 ENCOUNTER — Ambulatory Visit
Admission: RE | Admit: 2022-10-12 | Discharge: 2022-10-12 | Disposition: A | Discharge status: Home / Self Care | Payer: Medicare Other | Source: Ambulatory Visit | Attending: Specialist | Admitting: Specialist

## 2022-10-12 DIAGNOSIS — R0789 Other chest pain: Secondary | ICD-10-CM

## 2022-10-12 DIAGNOSIS — R7989 Other specified abnormal findings of blood chemistry: Secondary | ICD-10-CM | POA: Diagnosis present

## 2022-10-12 LAB — POCT I-STAT CREATININE: Creatinine, Ser: 1.4 mg/dL — ABNORMAL HIGH (ref 0.61–1.24)

## 2022-10-12 MED ORDER — IOHEXOL 350 MG/ML SOLN
75.0000 mL | Freq: Once | INTRAVENOUS | Status: AC | PRN
  Administered 2022-10-12: 75 mL via INTRAVENOUS

## 2023-10-11 ENCOUNTER — Other Ambulatory Visit: Payer: Self-pay | Admitting: Family Medicine

## 2023-10-11 ENCOUNTER — Ambulatory Visit
Admission: RE | Admit: 2023-10-11 | Discharge: 2023-10-11 | Disposition: A | Discharge status: Home / Self Care | Source: Ambulatory Visit | Attending: Family Medicine | Admitting: Family Medicine

## 2023-10-11 DIAGNOSIS — R911 Solitary pulmonary nodule: Secondary | ICD-10-CM

## 2023-11-06 ENCOUNTER — Other Ambulatory Visit: Payer: Self-pay | Admitting: Family Medicine

## 2023-11-06 DIAGNOSIS — R911 Solitary pulmonary nodule: Secondary | ICD-10-CM

## 2023-12-12 ENCOUNTER — Encounter (HOSPITAL_COMMUNITY): Payer: Self-pay

## 2023-12-12 ENCOUNTER — Encounter: Payer: Self-pay | Admitting: Gastroenterology

## 2023-12-19 ENCOUNTER — Other Ambulatory Visit: Payer: Self-pay

## 2023-12-19 ENCOUNTER — Ambulatory Visit
Admission: RE | Admit: 2023-12-19 | Discharge: 2023-12-19 | Disposition: A | Discharge status: Home / Self Care | Attending: Gastroenterology | Admitting: Gastroenterology

## 2023-12-19 ENCOUNTER — Ambulatory Visit: Admitting: Certified Registered Nurse Anesthetist

## 2023-12-19 ENCOUNTER — Encounter: Payer: Self-pay | Admitting: Gastroenterology

## 2023-12-19 ENCOUNTER — Encounter
Admission: RE | Disposition: A | Discharge status: Home / Self Care | Payer: Self-pay | Source: Home / Self Care | Attending: Gastroenterology

## 2023-12-19 DIAGNOSIS — G4730 Sleep apnea, unspecified: Secondary | ICD-10-CM | POA: Insufficient documentation

## 2023-12-19 DIAGNOSIS — K640 First degree hemorrhoids: Secondary | ICD-10-CM | POA: Diagnosis not present

## 2023-12-19 DIAGNOSIS — G709 Myoneural disorder, unspecified: Secondary | ICD-10-CM | POA: Diagnosis not present

## 2023-12-19 DIAGNOSIS — Z7984 Long term (current) use of oral hypoglycemic drugs: Secondary | ICD-10-CM | POA: Diagnosis not present

## 2023-12-19 DIAGNOSIS — E119 Type 2 diabetes mellitus without complications: Secondary | ICD-10-CM | POA: Diagnosis not present

## 2023-12-19 DIAGNOSIS — K219 Gastro-esophageal reflux disease without esophagitis: Secondary | ICD-10-CM | POA: Insufficient documentation

## 2023-12-19 DIAGNOSIS — I2510 Atherosclerotic heart disease of native coronary artery without angina pectoris: Secondary | ICD-10-CM | POA: Insufficient documentation

## 2023-12-19 DIAGNOSIS — K2970 Gastritis, unspecified, without bleeding: Secondary | ICD-10-CM | POA: Insufficient documentation

## 2023-12-19 DIAGNOSIS — Z9079 Acquired absence of other genital organ(s): Secondary | ICD-10-CM | POA: Insufficient documentation

## 2023-12-19 DIAGNOSIS — Z8546 Personal history of malignant neoplasm of prostate: Secondary | ICD-10-CM | POA: Insufficient documentation

## 2023-12-19 DIAGNOSIS — K5750 Diverticulosis of both small and large intestine without perforation or abscess without bleeding: Secondary | ICD-10-CM | POA: Diagnosis not present

## 2023-12-19 DIAGNOSIS — I10 Essential (primary) hypertension: Secondary | ICD-10-CM | POA: Diagnosis not present

## 2023-12-19 DIAGNOSIS — B9681 Helicobacter pylori [H. pylori] as the cause of diseases classified elsewhere: Secondary | ICD-10-CM | POA: Insufficient documentation

## 2023-12-19 DIAGNOSIS — Z9049 Acquired absence of other specified parts of digestive tract: Secondary | ICD-10-CM | POA: Insufficient documentation

## 2023-12-19 DIAGNOSIS — K5710 Diverticulosis of small intestine without perforation or abscess without bleeding: Secondary | ICD-10-CM | POA: Insufficient documentation

## 2023-12-19 DIAGNOSIS — K2289 Other specified disease of esophagus: Secondary | ICD-10-CM | POA: Diagnosis not present

## 2023-12-19 DIAGNOSIS — Z800 Family history of malignant neoplasm of digestive organs: Secondary | ICD-10-CM | POA: Insufficient documentation

## 2023-12-19 DIAGNOSIS — K635 Polyp of colon: Secondary | ICD-10-CM | POA: Insufficient documentation

## 2023-12-19 DIAGNOSIS — K31A0 Gastric intestinal metaplasia, unspecified: Secondary | ICD-10-CM | POA: Insufficient documentation

## 2023-12-19 DIAGNOSIS — R053 Chronic cough: Secondary | ICD-10-CM | POA: Insufficient documentation

## 2023-12-19 DIAGNOSIS — Z1211 Encounter for screening for malignant neoplasm of colon: Secondary | ICD-10-CM | POA: Insufficient documentation

## 2023-12-19 DIAGNOSIS — N289 Disorder of kidney and ureter, unspecified: Secondary | ICD-10-CM | POA: Diagnosis not present

## 2023-12-19 DIAGNOSIS — D122 Benign neoplasm of ascending colon: Secondary | ICD-10-CM | POA: Diagnosis not present

## 2023-12-19 DIAGNOSIS — Z87891 Personal history of nicotine dependence: Secondary | ICD-10-CM | POA: Insufficient documentation

## 2023-12-19 HISTORY — DX: Spinal stenosis, lumbar region without neurogenic claudication: M48.061

## 2023-12-19 HISTORY — DX: Personal history of malignant neoplasm of prostate: Z85.46

## 2023-12-19 HISTORY — DX: Other specified diseases of liver: K76.89

## 2023-12-19 HISTORY — PX: COLONOSCOPY: SHX5424

## 2023-12-19 HISTORY — PX: ESOPHAGOGASTRODUODENOSCOPY: SHX5428

## 2023-12-19 HISTORY — DX: Secundum atrial septal defect: Q21.11

## 2023-12-19 HISTORY — DX: Unilateral inguinal hernia, without obstruction or gangrene, not specified as recurrent: K40.90

## 2023-12-19 HISTORY — DX: Fatty (change of) liver, not elsewhere classified: K76.0

## 2023-12-19 HISTORY — DX: Atherosclerotic heart disease of native coronary artery without angina pectoris: I25.10

## 2023-12-19 HISTORY — DX: Sleep apnea, unspecified: G47.30

## 2023-12-19 HISTORY — DX: Type 2 diabetes mellitus without complications: E11.9

## 2023-12-19 HISTORY — DX: Paroxysmal atrial fibrillation: I48.0

## 2023-12-19 LAB — GLUCOSE, CAPILLARY: Glucose-Capillary: 121 mg/dL — ABNORMAL HIGH (ref 70–99)

## 2023-12-19 SURGERY — COLONOSCOPY
Anesthesia: General

## 2023-12-19 MED ORDER — PROPOFOL 500 MG/50ML IV EMUL
INTRAVENOUS | Status: DC | PRN
  Administered 2023-12-19: 160 ug/kg/min via INTRAVENOUS

## 2023-12-19 MED ORDER — PROPOFOL 1000 MG/100ML IV EMUL
INTRAVENOUS | Status: AC
  Filled 2023-12-19: qty 100

## 2023-12-19 MED ORDER — SODIUM CHLORIDE 0.9 % IV SOLN: INTRAVENOUS | Status: DC

## 2023-12-19 MED ORDER — GLYCOPYRROLATE 0.2 MG/ML IJ SOLN
INTRAMUSCULAR | Status: DC | PRN
Start: 2023-12-19 — End: 2023-12-19
  Administered 2023-12-19: .2 mg via INTRAVENOUS

## 2023-12-19 MED ORDER — EPHEDRINE SULFATE-NACL 50-0.9 MG/10ML-% IV SOSY
PREFILLED_SYRINGE | INTRAVENOUS | Status: DC | PRN
Start: 2023-12-19 — End: 2023-12-19
  Administered 2023-12-19 (×4): 5 mg via INTRAVENOUS

## 2023-12-19 MED ORDER — PROPOFOL 10 MG/ML IV BOLUS
INTRAVENOUS | Status: DC | PRN
  Administered 2023-12-19: 70 mg via INTRAVENOUS

## 2023-12-19 MED ORDER — LIDOCAINE HCL (CARDIAC) PF 100 MG/5ML IV SOSY
PREFILLED_SYRINGE | INTRAVENOUS | Status: DC | PRN
  Administered 2023-12-19: 100 mg via INTRAVENOUS

## 2023-12-19 NOTE — H&P (Signed)
 Pre-Procedure H&P   Patient ID: Ernest Pennington is a 68 y.o. male.  Gastroenterology Provider: Quintin Buckle, DO  Referring Provider: Jamee Mazzoni, PA PCP: Madaline Scales, PA-C  Date: 12/19/2023  HPI [name] is a 68 y.o. male who presents today for Esophagogastroduodenoscopy and Colonoscopy for GERD, chronic cough, colorectal cancer screening .  Patient with longstanding chronic cough.  Pulmonology assesses to rule out silent reflux.  Cough increases when he lies down.  No change with reflux medication.  No dysphagia or odynophagia  Reports regular bowel movements no melena or hematochezia.  Last colonoscopy October 2013 with sigmoid diverticulosis and internal hemorrhoids.  Otherwise normal Family history positive for colorectal cancer in his father (29) and possibly his sister  Hemoglobin 15.6 MCV 87 platelets 160,000 creatinine 1.2  Status post appendectomy and prostatectomy for prostate cancer   Past Medical History:  Diagnosis Date   ATRIAL SEPTAL DEFECT    Back pain    CAD    CAD (coronary artery disease)    Diabetes mellitus type 2, noninsulin dependent (HCC)    Diverticulitis    Fatty liver    Foraminal stenosis of lumbar region    Hernia, inguinal, right    Hernia, inguinal, right    History of rectal bleeding    HYPERLIPIDEMIA    HYPERTENSION    Liver cyst    Paroxysmal atrial fibrillation (HCC)    Persistent ostium secundum    Personal history of prostate cancer    Sleep apnea     Past Surgical History:  Procedure Laterality Date   APPENDECTOMY     CARDIAC CATHETERIZATION     COLONOSCOPY WITH PROPOFOL  N/A 12/05/2017   Procedure: COLONOSCOPY WITH PROPOFOL ;  Surgeon: Selena Daily, MD;  Location: [facility] ENDOSCOPY;  Service: Gastroenterology;  Laterality: N/A;   CORONARY ARTERY BYPASS GRAFT     FOOT SURGERY     HERNIA REPAIR     LEFT HEART CATH AND CORONARY ANGIOGRAPHY N/A 08/18/2021   Procedure: LEFT HEART CATH  AND CORONARY ANGIOGRAPHY;  Surgeon: Link Rice, MD;  Location: [facility] INVASIVE CV LAB;  Service: Cardiovascular;  Laterality: N/A;   LITHOTRIPSY     PROSTATE SURGERY  2018   repair of heart defect      Family History Crc- Father and possibly sister (listed in chart, but pt is unsure) No other h/o GI disease or malignancy  Review of Systems  Constitutional:  Negative for activity change, appetite change, chills, diaphoresis, fatigue, fever and unexpected weight change.  HENT:  Negative for trouble swallowing and voice change.   Respiratory:  Positive for cough. Negative for shortness of breath and wheezing.   Cardiovascular:  Negative for chest pain, palpitations and leg swelling.  Gastrointestinal:  Negative for abdominal distention, abdominal pain, anal bleeding, blood in stool, constipation, diarrhea, nausea and vomiting.  Musculoskeletal:  Negative for arthralgias and myalgias.  Skin:  Negative for color change and pallor.  Neurological:  Negative for dizziness, syncope and weakness.  Psychiatric/Behavioral:  Negative for confusion. The patient is not nervous/anxious.   All other systems reviewed and are negative.    Medications No current facility-administered medications on file prior to encounter.   Current Outpatient Medications on File Prior to Encounter  Medication Sig Dispense Refill   albuterol (VENTOLIN HFA) 108 (90 Base) MCG/ACT inhaler Inhale 2 puffs into the lungs every 6 (six) hours.     allopurinol (ZYLOPRIM) 300 MG tablet Take 300 mg by mouth daily.  aspirin  81 MG tablet Take 81 mg by mouth daily.       cloNIDine (CATAPRES) 0.1 MG tablet Take 0.1 mg by mouth 2 (two) times daily.     escitalopram (LEXAPRO) 10 MG tablet Take 5 mg by mouth daily.     glipiZIDE (GLUCOTROL XL) 10 MG 24 hr tablet Take 5 mg by mouth daily.     hydrochlorothiazide (HYDRODIURIL) 25 MG tablet Take 25 mg by mouth daily.     HYDROcodone bit-homatropine (HYCODAN) 5-1.5 MG/5ML syrup  Take 5 mLs by mouth every 6 (six) hours as needed.     hydrOXYzine (ATARAX) 25 MG tablet Take 25 mg by mouth every 8 (eight) hours as needed for anxiety.     levalbuterol (XOPENEX) 1.25 MG/0.5ML nebulizer solution Inhale into the lungs.     meloxicam (MOBIC) 7.5 MG tablet Take 7.5 mg by mouth daily.     pantoprazole (PROTONIX) 40 MG tablet Take 40 mg by mouth daily.     rosuvastatin (CRESTOR) 10 MG tablet Take 10 mg by mouth daily.     ANORO ELLIPTA 62.5-25 MCG/ACT AEPB Inhale 1 puff into the lungs daily. (Patient not taking: Reported on 08/15/2022)     apixaban (ELIQUIS) 5 MG TABS tablet Take 5 mg by mouth 2 (two) times daily. (Patient not taking: Reported on 08/15/2022)     budesonide (PULMICORT) 0.5 MG/2ML nebulizer solution Take 0.5 mg by nebulization daily.     diltiazem  (CARDIZEM  CD) 240 MG 24 hr capsule Take 1 capsule (240 mg total) by mouth daily. (Patient not taking: Reported on 09/27/2021) 30 capsule 11   metoprolol succinate (TOPROL-XL) 50 MG 24 hr tablet Take 1 tablet by mouth daily.     montelukast (SINGULAIR) 10 MG tablet Take 1 tablet by mouth at bedtime.     nitroGLYCERIN  (NITROSTAT ) 0.3 MG SL tablet Place 1 tablet (0.3 mg total) under the tongue every 5 (five) minutes as needed for chest pain. 100 tablet 0   omeprazole (PRILOSEC) 20 MG capsule Take 20 mg by mouth daily. (Patient not taking: Reported on 08/15/2022)     sertraline (ZOLOFT) 25 MG tablet Take 25 mg by mouth daily. (Patient not taking: Reported on 08/15/2022)     sitaGLIPtin  (JANUVIA ) 50 MG tablet Take 1 tablet (50 mg total) by mouth daily. (Patient not taking: Reported on 08/15/2022) 90 tablet 3   sodium chloride  HYPERTONIC 3 % nebulizer solution Take 4 mLs by nebulization as needed for cough. (Patient not taking: Reported on 08/15/2022)     spironolactone  (ALDACTONE ) 50 MG tablet Take 1 tablet (50 mg total) by mouth daily. (Patient not taking: Reported on 09/27/2021) 90 tablet 3   traZODone (DESYREL) 50 MG tablet Take 50 mg  by mouth at bedtime as needed for sleep. (Patient not taking: Reported on 08/15/2022)      Pertinent medications related to GI and procedure were reviewed by me with the patient prior to the procedure   Current Facility-Administered Medications:    0.9 %  sodium chloride  infusion, , Intravenous, Continuous, Ernest Buckle, DO, Last Rate: 20 mL/hr at 12/19/23 0831, Continued from Pre-op at 12/19/23 0831  sodium chloride  20 mL/hr at 12/19/23 0831       Allergies  Allergen Reactions   Amlodipine Anaphylaxis    Change in mental status     Furosemide Swelling   Lisinopril Anaphylaxis and Cough   Tamsulosin Swelling    Bilateral legs     Amlodipine Besy-Benazepril Hcl     Pt is  unsure if benazepril is a problem.    Atenolol Other (See Comments)    Mood changes    Metformin     Fatigue    Prednisone     Other Reaction(s): Unknown   Statins Other (See Comments)    declines    Allergies were reviewed by me prior to the procedure  Objective   Body mass index is 34.38 kg/m. Vitals:   12/19/23 0754  BP: (!) 199/106  Pulse: 69  Resp: 18  Temp: (!) 96 F (35.6 C)  TempSrc: Temporal  SpO2: 98%  Weight: 108.7 kg  Height: 5\' 10"  (1.778 m)     Physical Exam Vitals and nursing note reviewed.  Constitutional:      General: He is not in acute distress.    Appearance: Normal appearance. He is obese. He is not ill-appearing, toxic-appearing or diaphoretic.  HENT:     Head: Normocephalic and atraumatic.     Nose: Nose normal.     Mouth/Throat:     Mouth: Mucous membranes are moist.     Pharynx: Oropharynx is clear.  Eyes:     General: No scleral icterus.    Extraocular Movements: Extraocular movements intact.  Cardiovascular:     Rate and Rhythm: Normal rate and regular rhythm.     Heart sounds: Normal heart sounds. No murmur heard.    No friction rub. No gallop.  Pulmonary:     Effort: Pulmonary effort is normal. No respiratory distress.     Breath sounds:  Normal breath sounds. No wheezing, rhonchi or rales.  Abdominal:     General: Bowel sounds are normal. There is no distension.     Palpations: Abdomen is soft.     Tenderness: There is no abdominal tenderness. There is no guarding or rebound.  Musculoskeletal:     Cervical back: Neck supple.     Right lower leg: No edema.     Left lower leg: No edema.  Skin:    General: Skin is warm and dry.     Coloration: Skin is not jaundiced or pale.  Neurological:     General: No focal deficit present.     Mental Status: He is alert and oriented to person, place, and time. Mental status is at baseline.  Psychiatric:        Mood and Affect: Mood normal.        Behavior: Behavior normal.        Thought Content: Thought content normal.        Judgment: Judgment normal.      Assessment:  [name] is a 68 y.o. male  who presents today for Esophagogastroduodenoscopy and Colonoscopy for GERD, chronic cough, colorectal cancer screening .  Plan:  Esophagogastroduodenoscopy and Colonoscopy with possible intervention today  Esophagogastroduodenoscopy and Colonoscopy with possible biopsy, control of bleeding, polypectomy, and interventions as necessary has been discussed with the patient/patient representative. Informed consent was obtained from the patient/patient representative after explaining the indication, nature, and risks of the procedure including but not limited to death, bleeding, perforation, missed neoplasm/lesions, cardiorespiratory compromise, and reaction to medications. Opportunity for questions was given and appropriate answers were provided. Patient/patient representative has verbalized understanding is amenable to undergoing the procedure.   Ernest Buckle, DO  [facility]  Portions of the record may have been created with voice recognition software. Occasional wrong-word or 'sound-a-like' substitutions may have occurred due to the inherent  limitations of voice recognition software.  Read the chart  carefully and recognize, using context, where substitutions may have occurred.

## 2023-12-19 NOTE — Op Note (Addendum)
 [facility] Patient Name: Ernest Pennington Procedure Date: 12/19/2023 8:37 AM MRN: [id number] Account #: [id number] Date of Birth: 1956/03/28 Admit Type: Outpatient Age: 68 Room: [facility] ENDO ROOM 1 Gender: Male Note Status: Supervisor Override Instrument Name: Cristino Donna Endoscope [id number] Procedure:             Upper GI endoscopy Indications:           Gastro-esophageal reflux disease, Chronic cough Providers:             Quintin Buckle DO, DO Medicines:             Monitored Anesthesia Care Complications:         No immediate complications. Estimated blood loss:                         Minimal. Procedure:             Pre-Anesthesia Assessment:                        - Prior to the procedure, a History and Physical was                         performed, and patient medications and allergies were                         reviewed. The patient is competent. The risks and                         benefits of the procedure and the sedation options and                         risks were discussed with the patient. All questions                         were answered and informed consent was obtained.                         Patient identification and proposed procedure were                         verified by the physician, the nurse, the anesthetist                         and the technician in the endoscopy suite. Mental                         Status Examination: alert and oriented. Airway                         Examination: normal oropharyngeal airway and neck                         mobility. Respiratory Examination: clear to                         auscultation. CV Examination: RRR, no murmurs, no S3                         or S4. Prophylactic Antibiotics:  The patient does not                         require prophylactic antibiotics. Prior                         Anticoagulants: The patient has taken no anticoagulant                         or  antiplatelet agents. ASA Grade Assessment: III - A                         patient with severe systemic disease. After reviewing                         the risks and benefits, the patient was deemed in                         satisfactory condition to undergo the procedure. The                         anesthesia plan was to use monitored anesthesia care                         (MAC). Immediately prior to administration of                         medications, the patient was re-assessed for adequacy                         to receive sedatives. The heart rate, respiratory                         rate, oxygen saturations, blood pressure, adequacy of                         pulmonary ventilation, and response to care were                         monitored throughout the procedure. The physical                         status of the patient was re-assessed after the                         procedure.                        After obtaining informed consent, the endoscope was                         passed under direct vision. Throughout the procedure,                         the patient's blood pressure, pulse, and oxygen                         saturations were monitored continuously. The Endoscope  was introduced through the mouth, and advanced to the                         third part of duodenum. The upper GI endoscopy was                         accomplished without difficulty. The patient tolerated                         the procedure well. Findings:      The duodenal bulb, first portion of the duodenum, second portion of the       duodenum and third portion of the duodenum were normal. Estimated blood       loss: none.      A medium non-bleeding diverticulum was found in the second portion of       the duodenum. Estimated blood loss: none.      Diffuse mild inflammation characterized by erythema and granularity was       found in the entire examined stomach.  Biopsies were taken with a cold       forceps for Helicobacter pylori testing. Estimated blood loss was       minimal.      The exam of the stomach was otherwise normal.      Esophagogastric landmarks were identified: the gastroesophageal junction       was found at 40 cm from the incisors.      The Z-line was variable. <1cm in length, so biopsies were not performed.       Estimated blood loss: none.      The exam of the esophagus was otherwise normal. Impression:            - Normal duodenal bulb, first portion of the duodenum,                         second portion of the duodenum and third portion of                         the duodenum.                        - Non-bleeding duodenal diverticulum.                        - Gastritis. Biopsied.                        - Esophagogastric landmarks identified.                        - Z-line variable. Recommendation:        - Patient has a contact number available for                         emergencies. The signs and symptoms of potential                         delayed complications were discussed with the patient.                         Return to normal activities tomorrow. Written  discharge instructions were provided to the patient.                        - Discharge patient to home.                        - Resume previous diet.                        - Continue present medications.                        - Await pathology results.                        - Return to GI clinic as previously scheduled.                        - proceed with colonoscopy. see report for further                         recommendations.                        - The findings and recommendations were discussed with                         the patient. Procedure Code(s):     --- Professional ---                        [phone], Esophagogastroduodenoscopy, flexible,                         transoral; with biopsy, single or  multiple Diagnosis Code(s):     --- Professional ---                        K29.70, Gastritis, unspecified, without bleeding                        K22.89, Other specified disease of esophagus                        R05.3, Chronic cough                        K57.10, Diverticulosis of small intestine without                         perforation or abscess without bleeding CPT copyright 2022 [employer]. All rights reserved. The codes documented in this report are preliminary and upon coder review may  be revised to meet current compliance requirements. Attending Participation:      I personally performed the entire procedure. Polo Brisk, DO Quintin Buckle DO, DO 12/19/2023 9:02:53 AM This report has been signed electronically. Number of Addenda: 0 Note Initiated On: 12/19/2023 8:37 AM Estimated Blood Loss:  Estimated blood loss was minimal.      [facility]

## 2023-12-19 NOTE — Transfer of Care (Signed)
 Immediate Anesthesia Transfer of Care Note  Patient: Ernest Pennington  Procedure(s) Performed: COLONOSCOPY EGD (ESOPHAGOGASTRODUODENOSCOPY)  Patient Location: PACU  Anesthesia Type:General  Level of Consciousness: drowsy  Airway & Oxygen Therapy: Patient Spontanous Breathing  Post-op Assessment: Report given to RN and Post -op Vital signs reviewed and stable  Post vital signs: Reviewed and stable  Last Vitals:  Vitals Value Taken Time  BP    Temp    Pulse 89 12/19/23 0929  Resp 25 12/19/23 0929  SpO2 97 % 12/19/23 0929  Vitals shown include unfiled device data.  Last Pain:  Vitals:   12/19/23 0754  TempSrc: Temporal  PainSc: 0-No pain         Complications: No notable events documented.

## 2023-12-19 NOTE — Anesthesia Postprocedure Evaluation (Signed)
 Anesthesia Post Note  Patient: Ernest Pennington  Procedure(s) Performed: COLONOSCOPY EGD (ESOPHAGOGASTRODUODENOSCOPY)  Patient location during evaluation: Endoscopy Anesthesia Type: General Level of consciousness: awake and alert Pain management: pain level controlled Vital Signs Assessment: post-procedure vital signs reviewed and stable Respiratory status: spontaneous breathing, nonlabored ventilation, respiratory function stable and patient connected to nasal cannula oxygen Cardiovascular status: blood pressure returned to baseline and stable Postop Assessment: no apparent nausea or vomiting Anesthetic complications: no   No notable events documented.   Last Vitals:  Vitals:   12/19/23 0938 12/19/23 0948  BP: 126/85 135/83  Pulse:  72  Resp: 20   Temp:    SpO2:  99%    Last Pain:  Vitals:   12/19/23 0948  TempSrc:   PainSc: 0-No pain                 Portia Brittle Tausha Milhoan

## 2023-12-19 NOTE — Op Note (Signed)
 [facility] Gastroenterology Patient Name: Ernest Pennington Procedure Date: 12/19/2023 8:37 AM MRN: [id number] Account #: [id number] Date of Birth: 1955-08-21 Admit Type: Outpatient Age: 68 Room: [facility] ENDO ROOM 1 Gender: Male Note Status: Finalized Instrument Name: Colonoscope [id number] Procedure:             Colonoscopy Indications:           Screening for colorectal malignant neoplasm Providers:             Quintin Buckle DO, DO Medicines:             Monitored Anesthesia Care Complications:         No immediate complications. Estimated blood loss:                         Minimal. Procedure:             Pre-Anesthesia Assessment:                        - Prior to the procedure, a History and Physical was                         performed, and patient medications and allergies were                         reviewed. The patient is competent. The risks and                         benefits of the procedure and the sedation options and                         risks were discussed with the patient. All questions                         were answered and informed consent was obtained.                         Patient identification and proposed procedure were                         verified by the physician, the nurse, the anesthetist                         and the technician in the endoscopy suite. Mental                         Status Examination: alert and oriented. Airway                         Examination: normal oropharyngeal airway and neck                         mobility. Respiratory Examination: clear to                         auscultation. CV Examination: RRR, no murmurs, no S3                         or S4. Prophylactic Antibiotics: The patient does not  require prophylactic antibiotics. Prior                         Anticoagulants: The patient has taken no anticoagulant                         or antiplatelet agents. ASA Grade  Assessment: III - A                         patient with severe systemic disease. After reviewing                         the risks and benefits, the patient was deemed in                         satisfactory condition to undergo the procedure. The                         anesthesia plan was to use monitored anesthesia care                         (MAC). Immediately prior to administration of                         medications, the patient was re-assessed for adequacy                         to receive sedatives. The heart rate, respiratory                         rate, oxygen saturations, blood pressure, adequacy of                         pulmonary ventilation, and response to care were                         monitored throughout the procedure. The physical                         status of the patient was re-assessed after the                         procedure.                        After obtaining informed consent, the colonoscope was                         passed under direct vision. Throughout the procedure,                         the patient's blood pressure, pulse, and oxygen                         saturations were monitored continuously. The                         Colonoscope was introduced through the anus and  advanced to the the cecum, identified by appendiceal                         orifice and ileocecal valve. The colonoscopy was                         performed without difficulty. The patient tolerated                         the procedure well. The quality of the bowel                         preparation was evaluated using the BBPS [facility] Bowel                         Preparation Scale) with scores of: Right Colon = 2                         (minor amount of residual staining, small fragments of                         stool and/or opaque liquid, but mucosa seen well),                         Transverse Colon = 2 (minor amount of residual                          staining, small fragments of stool and/or opaque                         liquid, but mucosa seen well) and Left Colon = 2                         (minor amount of residual staining, small fragments of                         stool and/or opaque liquid, but mucosa seen well). The                         total BBPS score equals 6. The quality of the bowel                         preparation was good. The ileocecal valve, appendiceal                         orifice, and rectum were photographed. Findings:      The perianal and digital rectal examinations were normal. Pertinent       negatives include normal sphincter tone.      A 4 to 5 mm polyp was found in the ascending colon. The polyp was       sessile. The polyp was removed with a cold snare. Resection and       retrieval were complete. Estimated blood loss was minimal.      A 2 to 3 mm polyp was found in the descending colon. The polyp was       sessile. The polyp was removed with a jumbo cold forceps.  Resection and       retrieval were complete. Estimated blood loss was minimal.      Multiple small-mouthed diverticula were found in the entire colon.       Estimated blood loss: none.      Non-bleeding internal hemorrhoids were found during retroflexion. The       hemorrhoids were Grade I (internal hemorrhoids that do not prolapse).       Estimated blood loss: none.      The exam was otherwise without abnormality on direct and retroflexion       views. Impression:            - One 4 to 5 mm polyp in the ascending colon, removed                         with a cold snare. Resected and retrieved.                        - One 2 to 3 mm polyp in the descending colon, removed                         with a jumbo cold forceps. Resected and retrieved.                        - Diverticulosis in the entire examined colon.                        - Non-bleeding internal hemorrhoids.                        - The examination  was otherwise normal on direct and                         retroflexion views. Recommendation:        - Patient has a contact number available for                         emergencies. The signs and symptoms of potential                         delayed complications were discussed with the patient.                         Return to normal activities tomorrow. Written                         discharge instructions were provided to the patient.                        - Discharge patient to home.                        - Resume previous diet.                        - Continue present medications.                        - Repeat colonoscopy for surveillance based on  pathology results.                        - Return to referring physician as previously                         scheduled.                        - The findings and recommendations were discussed with                         the patient. Procedure Code(s):     --- Professional ---                        [phone], Colonoscopy, flexible; with removal of                         tumor(s), polyp(s), or other lesion(s) by snare                         technique                        [id number], 59, Colonoscopy, flexible; with biopsy, single                         or multiple Diagnosis Code(s):     --- Professional ---                        Z12.11, Encounter for screening for malignant neoplasm                         of colon                        D12.2, Benign neoplasm of ascending colon                        D12.4, Benign neoplasm of descending colon                        K64.0, First degree hemorrhoids                        K57.30, Diverticulosis of large intestine without                         perforation or abscess without bleeding CPT copyright 2022 American [employer]. All rights reserved. The codes documented in this report are preliminary and upon coder review may  be revised to meet current  compliance requirements. Attending Participation:      I personally performed the entire procedure. Polo Brisk, DO Quintin Buckle DO, DO 12/19/2023 9:29:32 AM This report has been signed electronically. Number of Addenda: 0 Note Initiated On: 12/19/2023 8:37 AM Scope Withdrawal Time: 0 hours 13 minutes 26 seconds  Total Procedure Duration: 0 hours 17 minutes 47 seconds  Estimated Blood Loss:  Estimated blood loss was minimal.      [facility]

## 2023-12-19 NOTE — Anesthesia Preprocedure Evaluation (Signed)
 Anesthesia Evaluation  Patient identified by MRN, date of birth, ID band Patient awake    Reviewed: Allergy & Precautions, NPO status , Patient's Chart, lab work & pertinent test results  History of Anesthesia Complications Negative for: history of anesthetic complications  Airway Mallampati: III  TM Distance: <3 FB Neck ROM: full    Dental  (+) Chipped   Pulmonary neg shortness of breath, sleep apnea , former smoker   Pulmonary exam normal        Cardiovascular Exercise Tolerance: Good hypertension, (-) angina + CAD  Normal cardiovascular exam     Neuro/Psych  Neuromuscular disease  negative psych ROS   GI/Hepatic negative GI ROS, Neg liver ROS,neg GERD  ,,  Endo/Other  diabetes, Type 2    Renal/GU Renal disease  negative genitourinary   Musculoskeletal   Abdominal   Peds  Hematology negative hematology ROS (+)   Anesthesia Other Findings Past Medical History: No date: ATRIAL SEPTAL DEFECT No date: Back pain No date: CAD No date: CAD (coronary artery disease) No date: Diabetes mellitus type 2, noninsulin dependent (HCC) No date: Diverticulitis No date: Fatty liver No date: Foraminal stenosis of lumbar region No date: Hernia, inguinal, right No date: Hernia, inguinal, right No date: History of rectal bleeding No date: HYPERLIPIDEMIA No date: HYPERTENSION No date: Liver cyst No date: Paroxysmal atrial fibrillation (HCC) No date: Persistent ostium secundum No date: Personal history of prostate cancer No date: Sleep apnea  Past Surgical History: No date: APPENDECTOMY No date: CARDIAC CATHETERIZATION 12/05/2017: COLONOSCOPY WITH PROPOFOL ; N/A     Comment:  Procedure: COLONOSCOPY WITH PROPOFOL ;  Surgeon: Selena Daily, MD;  Location: [facility] ENDOSCOPY;  Service:               Gastroenterology;  Laterality: N/A; No date: CORONARY ARTERY BYPASS GRAFT No date: FOOT SURGERY No date:  HERNIA REPAIR 08/18/2021: LEFT HEART CATH AND CORONARY ANGIOGRAPHY; N/A     Comment:  Procedure: LEFT HEART CATH AND CORONARY ANGIOGRAPHY;                Surgeon: Link Rice, MD;  Location: [facility]               INVASIVE CV LAB;  Service: Cardiovascular;  Laterality:               N/A; No date: LITHOTRIPSY 2018: PROSTATE SURGERY No date: repair of heart defect  BMI    Body Mass Index: 34.38 kg/m      Reproductive/Obstetrics negative OB ROS                             Anesthesia Physical Anesthesia Plan  ASA: 3  Anesthesia Plan: General   Post-op Pain Management:    Induction: Intravenous  PONV Risk Score and Plan: Propofol  infusion and TIVA  Airway Management Planned: Natural Airway and Nasal Cannula  Additional Equipment:   Intra-op Plan:   Post-operative Plan:   Informed Consent: I have reviewed the patients History and Physical, chart, labs and discussed the procedure including the risks, benefits and alternatives for the proposed anesthesia with the patient or authorized representative who has indicated his/her understanding and acceptance.     Dental Advisory Given  Plan Discussed with: Anesthesiologist, CRNA and Surgeon  Anesthesia Plan Comments: (Patient consented for risks of anesthesia including but not limited to:  - adverse  reactions to medications - risk of airway placement if required - damage to eyes, teeth, lips or other oral mucosa - nerve damage due to positioning  - sore throat or hoarseness - Damage to heart, brain, nerves, lungs, other parts of body or loss of life  Patient voiced understanding and assent.)       Anesthesia Quick Evaluation

## 2023-12-19 NOTE — Interval H&P Note (Signed)
 History and Physical Interval Note: Preprocedure H&P from 12/19/23  was reviewed and there was no interval change after seeing and examining the patient.  Written consent was obtained from the patient after discussion of risks, benefits, and alternatives. Patient has consented to proceed with Esophagogastroduodenoscopy and Colonoscopy with possible intervention   12/19/2023 8:40 AM  Ernest Pennington  has presented today for surgery, with the diagnosis of Colon cancer screening (Z12.11) Chronic cough (R05.3) Gastroesophageal reflux disease, unspecified whether esophagitis present (K21.9).  The various methods of treatment have been discussed with the patient and family. After consideration of risks, benefits and other options for treatment, the patient has consented to  Procedure(s) with comments: COLONOSCOPY (N/A) - DM EGD (ESOPHAGOGASTRODUODENOSCOPY) (N/A) as a surgical intervention.  The patient's history has been reviewed, patient examined, no change in status, stable for surgery.  I have reviewed the patient's chart and labs.  Questions were answered to the patient's satisfaction.     Quintin Buckle

## 2023-12-20 LAB — SURGICAL PATHOLOGY
